# Patient Record
Sex: Male | Born: 1970 | Race: White | Hispanic: No | Marital: Married | Smoking: Former smoker
Health system: Southern US, Community
[De-identification: ages and names within clinical notes are randomized; demographics above are authoritative.]

## PROBLEM LIST (undated history)

## (undated) DIAGNOSIS — F1010 Alcohol abuse, uncomplicated: Secondary | ICD-10-CM

## (undated) DIAGNOSIS — I219 Acute myocardial infarction, unspecified: Secondary | ICD-10-CM

## (undated) DIAGNOSIS — J449 Chronic obstructive pulmonary disease, unspecified: Secondary | ICD-10-CM

## (undated) DIAGNOSIS — Z951 Presence of aortocoronary bypass graft: Secondary | ICD-10-CM

## (undated) DIAGNOSIS — M1990 Unspecified osteoarthritis, unspecified site: Secondary | ICD-10-CM

## (undated) DIAGNOSIS — I10 Essential (primary) hypertension: Secondary | ICD-10-CM

## (undated) DIAGNOSIS — D649 Anemia, unspecified: Secondary | ICD-10-CM

## (undated) DIAGNOSIS — K219 Gastro-esophageal reflux disease without esophagitis: Secondary | ICD-10-CM

## (undated) DIAGNOSIS — E785 Hyperlipidemia, unspecified: Secondary | ICD-10-CM

## (undated) DIAGNOSIS — I252 Old myocardial infarction: Secondary | ICD-10-CM

## (undated) DIAGNOSIS — F32A Depression, unspecified: Secondary | ICD-10-CM

## (undated) DIAGNOSIS — F419 Anxiety disorder, unspecified: Secondary | ICD-10-CM

## (undated) DIAGNOSIS — F329 Major depressive disorder, single episode, unspecified: Secondary | ICD-10-CM

## (undated) DIAGNOSIS — I2510 Atherosclerotic heart disease of native coronary artery without angina pectoris: Secondary | ICD-10-CM

## (undated) HISTORY — DX: Gastro-esophageal reflux disease without esophagitis: K21.9

## (undated) HISTORY — DX: Anemia, unspecified: D64.9

## (undated) HISTORY — DX: Hyperlipidemia, unspecified: E78.5

## (undated) HISTORY — DX: Alcohol abuse, uncomplicated: F10.10

## (undated) HISTORY — DX: Atherosclerotic heart disease of native coronary artery without angina pectoris: I25.10

## (undated) HISTORY — DX: Acute myocardial infarction, unspecified: I21.9

## (undated) HISTORY — DX: Unspecified osteoarthritis, unspecified site: M19.90

## (undated) HISTORY — DX: Anxiety disorder, unspecified: F41.9

## (undated) HISTORY — PX: KNEE ARTHROCENTESIS: SUR44

## (undated) HISTORY — PX: TONSILECTOMY, ADENOIDECTOMY, BILATERAL MYRINGOTOMY AND TUBES: SHX2538

## (undated) HISTORY — DX: Essential (primary) hypertension: I10

## (undated) HISTORY — PX: APPENDECTOMY: SHX54

## (undated) HISTORY — DX: Depression, unspecified: F32.A

---
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
## 2000-04-28 ENCOUNTER — Encounter: Payer: Self-pay | Admitting: Emergency Medicine

## 2000-04-28 ENCOUNTER — Emergency Department (HOSPITAL_COMMUNITY): Admission: EM | Admit: 2000-04-28 | Discharge: 2000-04-28 | Payer: Self-pay | Admitting: Emergency Medicine

## 2000-05-07 ENCOUNTER — Emergency Department (HOSPITAL_COMMUNITY): Admission: EM | Admit: 2000-05-07 | Discharge: 2000-05-07 | Payer: Self-pay | Admitting: Emergency Medicine

## 2008-05-15 DIAGNOSIS — I252 Old myocardial infarction: Secondary | ICD-10-CM

## 2008-05-15 HISTORY — DX: Old myocardial infarction: I25.2

## 2010-05-15 DIAGNOSIS — Z951 Presence of aortocoronary bypass graft: Secondary | ICD-10-CM

## 2010-05-15 HISTORY — PX: CORONARY ARTERY BYPASS GRAFT: SHX141

## 2010-05-15 HISTORY — DX: Presence of aortocoronary bypass graft: Z95.1

## 2010-10-07 DIAGNOSIS — I2510 Atherosclerotic heart disease of native coronary artery without angina pectoris: Secondary | ICD-10-CM

## 2010-10-16 DIAGNOSIS — I2510 Atherosclerotic heart disease of native coronary artery without angina pectoris: Secondary | ICD-10-CM

## 2015-01-27 DIAGNOSIS — E785 Hyperlipidemia, unspecified: Secondary | ICD-10-CM | POA: Insufficient documentation

## 2015-01-27 DIAGNOSIS — Z951 Presence of aortocoronary bypass graft: Secondary | ICD-10-CM | POA: Insufficient documentation

## 2015-01-27 DIAGNOSIS — I10 Essential (primary) hypertension: Secondary | ICD-10-CM | POA: Insufficient documentation

## 2016-06-23 DIAGNOSIS — Z720 Tobacco use: Secondary | ICD-10-CM | POA: Insufficient documentation

## 2017-09-21 DIAGNOSIS — I2510 Atherosclerotic heart disease of native coronary artery without angina pectoris: Secondary | ICD-10-CM | POA: Insufficient documentation

## 2017-09-21 DIAGNOSIS — E785 Hyperlipidemia, unspecified: Secondary | ICD-10-CM | POA: Insufficient documentation

## 2018-04-03 DIAGNOSIS — I252 Old myocardial infarction: Secondary | ICD-10-CM | POA: Insufficient documentation

## 2018-04-03 DIAGNOSIS — R0789 Other chest pain: Secondary | ICD-10-CM | POA: Insufficient documentation

## 2018-05-02 DIAGNOSIS — M5136 Other intervertebral disc degeneration, lumbar region: Secondary | ICD-10-CM | POA: Insufficient documentation

## 2018-05-02 DIAGNOSIS — M4316 Spondylolisthesis, lumbar region: Secondary | ICD-10-CM | POA: Insufficient documentation

## 2018-05-02 DIAGNOSIS — M51369 Other intervertebral disc degeneration, lumbar region without mention of lumbar back pain or lower extremity pain: Secondary | ICD-10-CM | POA: Insufficient documentation

## 2018-12-13 DIAGNOSIS — N411 Chronic prostatitis: Secondary | ICD-10-CM | POA: Insufficient documentation

## 2018-12-13 DIAGNOSIS — N138 Other obstructive and reflux uropathy: Secondary | ICD-10-CM | POA: Insufficient documentation

## 2019-02-05 ENCOUNTER — Encounter: Payer: Self-pay | Admitting: Gastroenterology

## 2019-02-11 ENCOUNTER — Other Ambulatory Visit: Payer: Self-pay

## 2019-02-11 ENCOUNTER — Ambulatory Visit (INDEPENDENT_AMBULATORY_CARE_PROVIDER_SITE_OTHER): Payer: BC Managed Care – PPO | Admitting: Gastroenterology

## 2019-02-11 ENCOUNTER — Encounter: Payer: Self-pay | Admitting: Gastroenterology

## 2019-02-11 VITALS — BP 138/86 | HR 61 | Temp 97.9°F | Resp 18 | Ht 70.0 in | Wt 194.4 lb

## 2019-02-11 DIAGNOSIS — R1011 Right upper quadrant pain: Secondary | ICD-10-CM | POA: Diagnosis not present

## 2019-02-11 DIAGNOSIS — R140 Abdominal distension (gaseous): Secondary | ICD-10-CM

## 2019-02-11 DIAGNOSIS — K219 Gastro-esophageal reflux disease without esophagitis: Secondary | ICD-10-CM

## 2019-02-11 DIAGNOSIS — Z1211 Encounter for screening for malignant neoplasm of colon: Secondary | ICD-10-CM

## 2019-02-11 DIAGNOSIS — F1010 Alcohol abuse, uncomplicated: Secondary | ICD-10-CM

## 2019-02-11 DIAGNOSIS — R1013 Epigastric pain: Secondary | ICD-10-CM

## 2019-02-11 MED ORDER — SUPREP BOWEL PREP KIT 17.5-3.13-1.6 GM/177ML PO SOLN: 1.0000 | ORAL | 0 refills | Status: DC

## 2019-02-11 NOTE — Patient Instructions (Signed)
If you are age 50 or older, your body mass index should be between 23-30. Your Body mass index is 27.89 kg/m. If this is out of the aforementioned range listed, please consider follow up with your Primary Care Provider.  If you are age 63 or younger, your body mass index should be between 19-25. Your Body mass index is 27.89 kg/m. If this is out of the aformentioned range listed, please consider follow up with your Primary Care Provider.   You have been scheduled for an endoscopy and colonoscopy. Please follow the written instructions given to you at your visit today. Please pick up your prep supplies at the pharmacy within the next 1-3 days. If you use inhalers (even only as needed), please bring them with you on the day of your procedure. Your physician has requested that you go to www.startemmi.com and enter the access code given to you at your visit today. This web site gives a general overview about your procedure. However, you should still follow specific instructions given to you by our office regarding your preparation for the procedure.  We have sent the following medications to your pharmacy for you to pick up at your convenience: Suprep  It was a pleasure to see you today!  Vito Cirigliano, D.O.

## 2019-02-11 NOTE — Progress Notes (Signed)
Chief Complaint: GERD, upper abdominal pain, bloating, EtOH use disorder  Referring Provider:     Sarajane Jews, FNP   HPI:     Stephen Walsh is a 48 y.o. male with a history of hypertension, CAD (CABG 2013), hyperlipidemia, tobacco use, EtOH abuse, GERD, referred to the [facility] for evaluation of abdominal pain, bloating, increased flatus and long standing reflux sxs.   States he has had reflux sxs for years, characterized by HB, regurgitation. No dysphagia. Treated with Nexium years ago with resolution, then changed to Prilosec 20 mg/day 3 years ago. Generally well controlled. Breakthough sxs 1-2 times/week, usually with forward flexion. No prior EGD.   Endorses RUQ pain for the last 3-4 months. Occurs intermittently, but can last through the day. Worse with EtOH. Was drinking 24 beers/day for many years. Stopped drinking 3-4 weeks ago after his PCM said to quit. No withdrawal sxs.  Pain perhaps less in intensity now, but still occurs in the RUQ and MEG.  Not associated with p.o. intake.  Did report he had LAEs in the 600's 2-3 years ago, but o/w no known hx of liver disease.  States his PCM told him that his LAE's were again elevated recently, and told him to quit drinking.  He stopped drinking 3 to 4 weeks ago, and reports he had improved LAE's on repeat labs.  None of these records are in EMR for review and he did not bring any copies today.  Otherwise, no history of ascites, jaundice, icteric sclera.  No d/c/f/c/n/v. No hematochezia, melena.   No prior colonoscopy.   No known family history of CRC, GI malignancy, liver disease, pancreatic disease, or IBD.   Past Medical History:  Diagnosis Date  . Alcohol abuse    16 oz cans-up to 18 cans per day  . Anemia   . Anxiety   . Arthritis   . Coronary artery disease   . Depression   . History of heart attack 2010  . Hx of CABG 2012  . Hypertension      No family history on file. Social History    Tobacco Use  . Smoking status: Current Every Day Smoker    Packs/day: 1.00    Types: Cigarettes    Start date: 22  . Smokeless tobacco: Former Systems developer    Types: Chew    Quit date: 1982  Substance Use Topics  . Alcohol use: Not Currently  . Drug use: Not Currently   Current Outpatient Medications  Medication Sig Dispense Refill  . aspirin 81 MG chewable tablet Chew 81 mg by mouth 2 (two) times daily.    Marland Kitchen gabapentin (NEURONTIN) 300 MG capsule Take 300 mg by mouth 3 (three) times daily.    . isosorbide dinitrate (ISORDIL) 20 MG tablet Take 20 mg by mouth daily.    . meloxicam (MOBIC) 15 MG tablet Take 15 mg by mouth daily.    . metoprolol tartrate (LOPRESSOR) 100 MG tablet Take 100 mg by mouth daily.    Marland Kitchen omeprazole (PRILOSEC) 20 MG capsule Take 20 mg by mouth daily.    . pravastatin (PRAVACHOL) 40 MG tablet Take 40 mg by mouth daily.    . tadalafil (CIALIS) 20 MG tablet Take 20 mg by mouth daily as needed for erectile dysfunction.     No current facility-administered medications for this visit.    No Known Allergies   Review of Systems: All systems reviewed  and negative except where noted in HPI.     Physical Exam:    Wt Readings from Last 3 Encounters:  02/11/19 194 lb 6.4 oz (88.2 kg)    BP 138/86 (BP Location: Left Arm, Patient Position: Sitting, Cuff Size: Normal)   Pulse 61   Temp 97.9 F (36.6 C) (Oral)   Resp 18   Ht 5\' 10"  (1.778 m)   Wt 194 lb 6.4 oz (88.2 kg)   SpO2 97%   BMI 27.89 kg/m  Constitutional:  Pleasant, in no acute distress. Psychiatric: Normal mood and affect. Behavior is normal. EENT: Pupils normal.  Conjunctivae are normal. No scleral icterus. Neck supple. No cervical LAD. Cardiovascular: Normal rate, regular rhythm. No edema Pulmonary/chest: Effort normal and breath sounds normal. No wheezing, rales or rhonchi. Abdominal: Mild TTP in RUQ and MEG without rebound or guarding. Soft, nondistended. Bowel sounds active throughout. There are  no masses palpable. No hepatomegaly. Neurological: Alert and oriented to person place and time. Skin: Skin is warm and dry. No rashes noted.   ASSESSMENT AND PLAN;   1) GERD: -Longstanding history of reflux, which is generally well controlled with omeprazole daily.  Does have breakthrough symptoms 1-2 times/week, which would suggest reflux is less well controlled than he reports. -EGD to assess for erosive esophagitis, LES laxity, hiatal hernia - Consider escalation of therapy pending EGD findings -Resume antireflux lifestyle/dietary modifications  2) MEG/RUQ pain 3) Bloating -EGD with random and directed gastric/duodenal biopsies - Assess for portal hypertension at time of EGD - Do have some degree of suspicion for RUQ pain to be related to subclinical/mild intermittent alcoholic hepatitis - May benefit from probiotic  4) Alcohol use disorder 5) Elevated liver enzymes Significant EtOH intake for many years and reported elevated liver enzymes. -Will obtain old records for review -Holding off on ordering new labs for extended serologic work-up or imaging studies pending review of recent labs -Applauded his abstinence from alcohol over the last 3 to 4 weeks and strongly encouraged him that complete avoidance of alcohol would be the strongest measure to avoid future insult to his liver  6) Colon cancer screening -Colonoscopy  The indications, risks, and benefits of EGD and colonoscopy were explained to the patient in detail. Risks include but are not limited to bleeding, perforation, adverse reaction to medications, and cardiopulmonary compromise. Sequelae include but are not limited to the possibility of surgery, hositalization, and mortality. The patient verbalized understanding and wished to proceed. All questions answered, referred to scheduler and bowel prep ordered. Further recommendations pending results of the exam.    Lavena Bullion, DO, FACG  02/11/2019, 3:26 PM   Sarajane Jews, FNP

## 2019-02-12 ENCOUNTER — Telehealth: Payer: Self-pay

## 2019-02-12 NOTE — Telephone Encounter (Signed)
BUN 11 mg/dL Creatinine 0.96 mg/dL

## 2019-02-14 ENCOUNTER — Telehealth: Payer: Self-pay | Admitting: Gastroenterology

## 2019-02-14 NOTE — Telephone Encounter (Addendum)
Received recent note dated 01/27/2019 and labs from [facility].  Lab collection from 01/27/2019 notable for the following: -Normal CBC with WBC 6.3, H/H 16.7/47.4, PLT 174 - Normal CMP with AST/ALT 26/31, ALP 72, T bili 0.7, creatinine 0.96  Labs from 11/29/2018 notable for the following: -AST/ALT 44/77, normal T bili, ALP, creatinine, albumin -Normal CBC  Labs from 09/30/2018: -AST/ALT 31/44, CMP otherwise normal  Labs from 04/02/2018: -AST/ALT 23/21, and all CMP otherwise normal  -Downtrending AST/ALT since cessation of alcohol.  Labs otherwise without serologic evidence of impaired hepatic synthetic function -Plan to proceed with EGD for upper GI symptoms as previously discussed, along with colonoscopy for routine CRC screening - No further labs needed at this time.  Repeat labs in 6 months (liver function panel) -Continue complete abstinence of alcohol

## 2019-03-06 DIAGNOSIS — Z01818 Encounter for other preprocedural examination: Secondary | ICD-10-CM | POA: Diagnosis not present

## 2019-03-07 ENCOUNTER — Encounter: Payer: Self-pay | Admitting: Gastroenterology

## 2019-03-12 ENCOUNTER — Encounter: Payer: BC Managed Care – PPO | Admitting: Gastroenterology

## 2019-03-17 DIAGNOSIS — Z01810 Encounter for preprocedural cardiovascular examination: Secondary | ICD-10-CM | POA: Insufficient documentation

## 2019-03-19 ENCOUNTER — Ambulatory Visit (AMBULATORY_SURGERY_CENTER): Payer: BC Managed Care – PPO | Admitting: Gastroenterology

## 2019-03-19 ENCOUNTER — Telehealth: Payer: Self-pay

## 2019-03-19 ENCOUNTER — Other Ambulatory Visit: Payer: Self-pay

## 2019-03-19 ENCOUNTER — Encounter: Payer: Self-pay | Admitting: Gastroenterology

## 2019-03-19 VITALS — BP 100/60 | HR 71 | Temp 98.5°F | Resp 15 | Ht 70.0 in

## 2019-03-19 DIAGNOSIS — D124 Benign neoplasm of descending colon: Secondary | ICD-10-CM | POA: Diagnosis not present

## 2019-03-19 DIAGNOSIS — K2970 Gastritis, unspecified, without bleeding: Secondary | ICD-10-CM | POA: Diagnosis not present

## 2019-03-19 DIAGNOSIS — K449 Diaphragmatic hernia without obstruction or gangrene: Secondary | ICD-10-CM | POA: Diagnosis not present

## 2019-03-19 DIAGNOSIS — K3189 Other diseases of stomach and duodenum: Secondary | ICD-10-CM | POA: Diagnosis not present

## 2019-03-19 DIAGNOSIS — K219 Gastro-esophageal reflux disease without esophagitis: Secondary | ICD-10-CM

## 2019-03-19 DIAGNOSIS — Z1211 Encounter for screening for malignant neoplasm of colon: Secondary | ICD-10-CM | POA: Diagnosis not present

## 2019-03-19 DIAGNOSIS — K2270 Barrett's esophagus without dysplasia: Secondary | ICD-10-CM

## 2019-03-19 DIAGNOSIS — D125 Benign neoplasm of sigmoid colon: Secondary | ICD-10-CM | POA: Diagnosis not present

## 2019-03-19 DIAGNOSIS — K640 First degree hemorrhoids: Secondary | ICD-10-CM

## 2019-03-19 DIAGNOSIS — D120 Benign neoplasm of cecum: Secondary | ICD-10-CM | POA: Diagnosis not present

## 2019-03-19 MED ORDER — SODIUM CHLORIDE 0.9 % IV SOLN: 500.0000 mL | INTRAVENOUS | Status: DC

## 2019-03-19 MED ORDER — OMEPRAZOLE 20 MG PO CPDR: 20.0000 mg | DELAYED_RELEASE_CAPSULE | Freq: Two times a day (BID) | ORAL | 0 refills | Status: DC

## 2019-03-19 NOTE — Progress Notes (Signed)
Pt tolerated well. Teeth unchanged. VSS. Pt arousible ands to recovery.

## 2019-03-19 NOTE — Op Note (Signed)
[place] Patient Name: Stephen Walsh Procedure Date: 03/19/2019 10:01 AM MRN: [id number] Endoscopist: Gerrit Heck , MD Age: 48 Referring MD:  Date of Birth: 03/08/1971 Gender: Male Account #: [id number] Procedure:                Colonoscopy Indications:              Screening for colorectal malignant neoplasm, This                            is the patient's first colonoscopy Medicines:                Monitored Anesthesia Care Procedure:                Pre-Anesthesia Assessment:                           - Prior to the procedure, a History and Physical                            was performed, and patient medications and                            allergies were reviewed. The patient's tolerance of                            previous anesthesia was also reviewed. The risks                            and benefits of the procedure and the sedation                            options and risks were discussed with the patient.                            All questions were answered, and informed consent                            was obtained. Prior Anticoagulants: The patient has                            taken no previous anticoagulant or antiplatelet                            agents. ASA Grade Assessment: III - A patient with                            severe systemic disease. After reviewing the risks                            and benefits, the patient was deemed in                            satisfactory condition to undergo the procedure.  After obtaining informed consent, the colonoscope                            was passed under direct vision. Throughout the                            procedure, the patient's blood pressure, pulse, and                            oxygen saturations were monitored continuously. The                            Colonoscope was introduced through the anus and                            advanced to the the  cecum, identified by                            appendiceal orifice and ileocecal valve. The                            colonoscopy was performed without difficulty. The                            patient tolerated the procedure well. The quality                            of the bowel preparation was adequate. The                            ileocecal valve, appendiceal orifice, and rectum                            were photographed. Scope In: 10:26:09 AM Scope Out: 11:09:17 AM Scope Withdrawal Time: 0 hours 40 minutes 8 seconds  Total Procedure Duration: 0 hours 43 minutes 8 seconds  Findings:                 The perianal and digital rectal examinations were                            normal.                           A 8 mm polyp was found in the cecum. The polyp was                            sessile. The polyp was removed with a cold snare.                            Resection and retrieval were complete. Estimated                            blood loss was minimal.  A 15 mm polyp was found in the descending colon.                            The polyp was flat. The polyp was removed with a                            saline injection-lift technique using a cold snare.                            Resection and retrieval were complete. There was a                            single site of mucosal oozing that was successfully                            treated with one hemostatic clip (MR conditional).                            There was no bleeding at the end of the procedure.                            Estimated blood loss was minimal.                           Four sessile polyps were found in the sigmoid                            colon. The polyps were 4 to 8 mm in size. These                            polyps were removed with a cold snare. Resection                            and retrieval were complete. Estimated blood loss                            was  minimal.                           Non-bleeding internal hemorrhoids were found during                            retroflexion. The hemorrhoids were small. Complications:            No immediate complications. Estimated Blood Loss:     Estimated blood loss was minimal. Impression:               - One 8 mm polyp in the cecum, removed with a cold                            snare. Resected and retrieved.                           -  One 15 mm polyp in the descending colon, removed                            using injection-lift and a cold snare. Resected and                            retrieved. Clip (MR conditional) was placed.                           - Four 4 to 8 mm polyps in the sigmoid colon,                            removed with a cold snare. Resected and retrieved.                           - Non-bleeding internal hemorrhoids. Recommendation:           - Patient has a contact number available for                            emergencies. The signs and symptoms of potential                            delayed complications were discussed with the                            patient. Return to normal activities tomorrow.                            Written discharge instructions were provided to the                            patient.                           - Resume previous diet.                           - Continue present medications.                           - Await pathology results.                           - Repeat colonoscopy in 1 year for surveillance.                           - Return to GI clinic at appointment to be                            scheduled. Gerrit Heck, MD 03/19/2019 11:21:04 AM

## 2019-03-19 NOTE — Progress Notes (Signed)
Pt took a while to wake after procedure, confused and pulling off gown, etc, pt wakes after about 20 minutes and is lucid and alert.  Pt instructed as per usual re discharge instructions, reviewed with wife on the phone and let her know to call office if any questions.

## 2019-03-19 NOTE — Patient Instructions (Signed)
Handouts given for gastritis, Hiatal Hernia, GERD, polyps and hemorrhoids.  Clip card give for clip that is in place, you may pass it in your stool over the next couple weeks, there is nothing you need to follow up on with this, carry card in your wallet.  prilosec 20 mg is changed to TWICE daily for 6 weeks (you were taking it once daily) please pick up new Rx.  GI clinic will call to schedule a follow up appointment.  YOU HAD AN ENDOSCOPIC PROCEDURE TODAY AT [facility]:   Refer to the procedure report that was given to you for any specific questions about what was found during the examination.  If the procedure report does not answer your questions, please call your gastroenterologist to clarify.  If you requested that your care partner not be given the details of your procedure findings, then the procedure report has been included in a sealed envelope for you to review at your convenience later.  YOU SHOULD EXPECT: Some feelings of bloating in the abdomen. Passage of more gas than usual.  Walking can help get rid of the air that was put into your GI tract during the procedure and reduce the bloating. If you had a lower endoscopy (such as a colonoscopy or flexible sigmoidoscopy) you may notice spotting of blood in your stool or on the toilet paper. If you underwent a bowel prep for your procedure, you may not have a normal bowel movement for a few days.  Please Note:  You might notice some irritation and congestion in your nose or some drainage.  This is from the oxygen used during your procedure.  There is no need for concern and it should clear up in a day or so.  SYMPTOMS TO REPORT IMMEDIATELY:   Following lower endoscopy (colonoscopy or flexible sigmoidoscopy):  Excessive amounts of blood in the stool  Significant tenderness or worsening of abdominal pains  Swelling of the abdomen that is new, acute  Fever of 100F or higher   Following upper endoscopy (EGD)  Vomiting  of blood or coffee ground material  New chest pain or pain under the shoulder blades  Painful or persistently difficult swallowing  New shortness of breath  Fever of 100F or higher  Black, tarry-looking stools  For urgent or emergent issues, a gastroenterologist can be reached at any hour by calling [phone].   DIET:  We do recommend a small meal at first, but then you may proceed to your regular diet.  Drink plenty of fluids but you should avoid alcoholic beverages for 24 hours.  ACTIVITY:  You should plan to take it easy for the rest of today and you should NOT DRIVE or use heavy machinery until tomorrow (because of the sedation medicines used during the test).    FOLLOW UP: Our staff will call the number listed on your records 48-72 hours following your procedure to check on you and address any questions or concerns that you may have regarding the information given to you following your procedure. If we do not reach you, we will leave a message.  We will attempt to reach you two times.  During this call, we will ask if you have developed any symptoms of COVID 19. If you develop any symptoms (ie: fever, flu-like symptoms, shortness of breath, cough etc.) before then, please call [phone].  If you test positive for Covid 19 in the 2 weeks post procedure, please call and report this information to [place].  If any biopsies were taken you will be contacted by phone or by letter within the next 1-3 weeks.  Please call us at [phone] if you have not heard about the biopsies in 3 weeks.    SIGNATURES/CONFIDENTIALITY: You and/or your care partner have signed paperwork which will be entered into your electronic medical record.  These signatures attest to the fact that that the information above on your After Visit Summary has been reviewed and is understood.  Full responsibility of the confidentiality of this discharge information lies with you and/or your care-partner.

## 2019-03-19 NOTE — Telephone Encounter (Signed)
Approval for Omeprazole taking 2 times daily Case number [id number]

## 2019-03-19 NOTE — Op Note (Signed)
[facility] [place] Patient Name: Stephen Walsh Procedure Date: 03/19/2019 10:02 AM MRN: [id number] Endoscopist: Gerrit Heck , MD Age: 48 Referring MD:  Date of Birth: Apr 02, 1971 Gender: Male Account #: [id number] Procedure:                Upper GI endoscopy Indications:              Epigastric abdominal pain, Abdominal pain in the                            right upper quadrant, Suspected esophageal reflux Medicines:                Monitored Anesthesia Care Procedure:                Pre-Anesthesia Assessment:                           - Prior to the procedure, a History and Physical                            was performed, and patient medications and                            allergies were reviewed. The patient's tolerance of                            previous anesthesia was also reviewed. The risks                            and benefits of the procedure and the sedation                            options and risks were discussed with the patient.                            All questions were answered, and informed consent                            was obtained. Prior Anticoagulants: The patient has                            taken no previous anticoagulant or antiplatelet                            agents. ASA Grade Assessment: III - A patient with                            severe systemic disease. After reviewing the risks                            and benefits, the patient was deemed in                            satisfactory condition to undergo the procedure.  After obtaining informed consent, the endoscope was                            passed under direct vision. Throughout the                            procedure, the patient's blood pressure, pulse, and                            oxygen saturations were monitored continuously. The                            Endoscope was introduced through the mouth, and   advanced to the second part of duodenum. The upper                            GI endoscopy was accomplished without difficulty.                            The patient tolerated the procedure well. Scope In: Scope Out: Findings:                 Two tongues of salmon-colored mucosa were present                            from 39 to 40 cm. No other visible abnormalities                            were present. Biopsies were taken with a cold                            forceps for histology. Estimated blood loss was                            minimal.                           Esophagogastric landmarks were identified: the                            gastroesophageal junction was found at 40 cm and                            the site of hiatal narrowing was found at 43 cm                            from the incisors.                           A 3 cm hiatal hernia was present.                           Scattered mild inflammation characterized by  erythema was found in the gastric body and in the                            gastric antrum. Biopsies were taken with a cold                            forceps for Helicobacter pylori testing. Estimated                            blood loss was minimal.                           The gastric fundus was normal.                           Localized mildly erythematous mucosa without active                            bleeding and with no stigmata of bleeding was found                            in the duodenal bulb. Biopsies were taken with a                            cold forceps for histology. Estimated blood loss                            was minimal.                           The second portion of the duodenum was normal. Complications:            No immediate complications. Estimated Blood Loss:     Estimated blood loss was minimal. Impression:               - Salmon-colored mucosa suspicious for                             short-segment Barrett's esophagus. Biopsied.                           - Esophagogastric landmarks identified.                           - 3 cm hiatal hernia.                           - Gastritis. Biopsied.                           - Normal gastric fundus.                           - Erythematous duodenopathy. Biopsied.                           - Normal second portion  of the duodenum. Recommendation:           - Patient has a contact number available for                            emergencies. The signs and symptoms of potential                            delayed complications were discussed with the                            patient. Return to normal activities tomorrow.                            Written discharge instructions were provided to the                            patient.                           - Resume previous diet.                           - Continue present medications.                           - Await pathology results.                           - Use Prilosec (omeprazole) 20 mg PO BID for 6                            weeks. Gerrit Heck, MD 03/19/2019 11:27:58 AM

## 2019-03-19 NOTE — Progress Notes (Signed)
Called to room to assist during endoscopic procedure.  Patient ID and intended procedure confirmed with present staff. Received instructions for my participation in the procedure from the performing physician.  

## 2019-03-21 ENCOUNTER — Telehealth: Payer: Self-pay | Admitting: *Deleted

## 2019-03-21 NOTE — Telephone Encounter (Signed)
  Follow up Call-  Call back number 03/19/2019  Post procedure Call Back phone  # [phone] talk to wife  Permission to leave phone message Yes  Some recent data might be hidden  1.  Have you developed a fever since your procedure? no  2.   Have you had an respiratory symptoms (SOB or cough) since your procedure? no  3.   Have you tested positive for COVID 19 since your procedure no  4.   Have you had any family members/close contacts diagnosed with the COVID 19 since your procedure?  no   If yes to any of these questions please route to Joylene John, RN and Alphonsa Gin, Therapist, sports.   Patient questions:  Do you have a fever, pain , or abdominal swelling? No. Pain Score  0 *  Have you tolerated food without any problems? Yes.    Have you been able to return to your normal activities? Yes.    Do you have any questions about your discharge instructions: Diet   No. Medications  No. Follow up visit  No.  Do you have questions or concerns about your Care? No.  Actions: * If pain score is 4 or above: No action needed, pain <4.

## 2019-04-07 HISTORY — PX: BACK SURGERY: SHX140

## 2019-04-21 ENCOUNTER — Other Ambulatory Visit: Payer: Self-pay

## 2019-04-21 ENCOUNTER — Ambulatory Visit (INDEPENDENT_AMBULATORY_CARE_PROVIDER_SITE_OTHER): Payer: BC Managed Care – PPO | Admitting: Gastroenterology

## 2019-04-21 ENCOUNTER — Encounter: Payer: Self-pay | Admitting: Gastroenterology

## 2019-04-21 VITALS — BP 124/76 | HR 71 | Temp 97.7°F | Ht 71.0 in | Wt 207.5 lb

## 2019-04-21 DIAGNOSIS — K219 Gastro-esophageal reflux disease without esophagitis: Secondary | ICD-10-CM

## 2019-04-21 DIAGNOSIS — K5903 Drug induced constipation: Secondary | ICD-10-CM

## 2019-04-21 DIAGNOSIS — Z1159 Encounter for screening for other viral diseases: Secondary | ICD-10-CM | POA: Diagnosis not present

## 2019-04-21 DIAGNOSIS — K449 Diaphragmatic hernia without obstruction or gangrene: Secondary | ICD-10-CM

## 2019-04-21 DIAGNOSIS — F17200 Nicotine dependence, unspecified, uncomplicated: Secondary | ICD-10-CM

## 2019-04-21 DIAGNOSIS — K22710 Barrett's esophagus with low grade dysplasia: Secondary | ICD-10-CM | POA: Diagnosis not present

## 2019-04-21 DIAGNOSIS — F1021 Alcohol dependence, in remission: Secondary | ICD-10-CM

## 2019-04-21 DIAGNOSIS — Z86010 Personal history of colonic polyps: Secondary | ICD-10-CM

## 2019-04-21 NOTE — Patient Instructions (Signed)
You have been scheduled for an endoscopy. Please follow written instructions given to you at your visit today. If you use inhalers (even only as needed), please bring them with you on the day of your procedure. Your physician has requested that you go to www.startemmi.com and enter the access code given to you at your visit today. This web site gives a general overview about your procedure. However, you should still follow specific instructions given to you by our office regarding your preparation for the procedure.  It was a pleasure to see you today!  Vito Cirigliano, D.O.  

## 2019-04-21 NOTE — Progress Notes (Signed)
P  Chief Complaint:    Constipation, GERD with Barrett's  GI History: 48 y.o. male with a history of hypertension, CAD (CABG 2013), hyperlipidemia, tobacco use, EtOH abuse, who follows in the GI clinic for the following issues:  1) GERD with Barrett's Esophagus: Longstanding history of reflux,characterized by HB, regurgitation. No dysphagia. Treated with Nexium years ago with resolution, then changed to Prilosec 20 mg/day 3 years ago. Generally well controlled. Breakthough sxs 1-2 times/week, usually with forward flexion.  Index EGD in 03/2019 with short segment Barrett's Esophagus with low-grade dysplasia approaching high-grade.  Started on high-dose PPI therapy, with plan for short interval repeat EGD.  2) RUQ pain: Symptoms occurred intermittently, worse with EtOH intake.  Was drinking 24 beers/day for many years, but stopped drinking in 12/2018 after his PCM told him to quit.  Symptoms have since resolved.  EGD  3) Elevated liver enzymes: 4) History of EtOH use disorder: Long history of heavy EtOH intake, but quit drinking entirely in 12/2018.  Reported history of AST/ALT in the 600s approximately 2017 per patient.  Normal LAE's in 01/2019 after quitting EtOH.  PLT 174, normal CBC.  EGD negative for varices or PHG.  Endoscopic history: -EGD (03/2019, Dr. Bryan Lemma): 2 tongues of Barrett's Esophagus, each 1 cm (biopsy: Low-grade dysplasia approaching high-grade dysplasia), 3 cm HH, mild gastritis/duodenitis -Colonoscopy (03/2019, Dr. Bryan Lemma): 6 tubular adenomas, largest 15 mm resected with saline inject-left and snare.  Internal hemorrhoids.  Repeat in 3 years  HPI:    Patient is a 48 y.o. male presenting to the Gastroenterology Clinic for follow-up. Initally seen by me in 01/2019 for GERD, upper abdominal pain, bloating, as above.  Abdominal pain and bloating have since resolved.  Reflux generally well controlled since starting on high-dose PPI therapy.  Rare use of Tums for  breakthrough.  EGD with short segment BE with low grade dysplasia approaching high grade, with plan to repeat in 2 months wth repeat bxs. Started on PPI BID with good clinical improvement.    Colonoscopy completed for routine, average risk CRC screening and n/f 6 TAs, largest 15 mm in size, resected with saline inject/lift and snare, with plan to repeat in 3 years.   L-spine surgery last week. Now with constipation due to pain meds post op. Tried Mag Citrate (1/2 bottle) without improvement. Has been taking stools softeners with improvement.   Has since quit smoking a month ago, with Wellbutrin for cessation, which he has since stopped.  Still no EtOH since 12/2018.     Review of systems:     No chest pain, no SOB, no fevers, no urinary sx   Past Medical History:  Diagnosis Date  . Alcohol abuse    16 oz cans-up to 18 cans per day  . Anemia   . Anxiety   . Arthritis   . Coronary artery disease   . Depression   . GERD (gastroesophageal reflux disease)   . History of heart attack 2010  . Hx of CABG 2012  . Hyperlipidemia   . Hypertension   . Myocardial infarction [facility])     Patient's surgical history, family medical history, social history, medications and allergies were all reviewed in Epic    Current Outpatient Medications  Medication Sig Dispense Refill  . aspirin 81 MG chewable tablet Chew 81 mg by mouth 2 (two) times daily.    Marland Kitchen gabapentin (NEURONTIN) 300 MG capsule Take 300 mg by mouth 3 (three) times daily.    . isosorbide  dinitrate (ISORDIL) 20 MG tablet Take 20 mg by mouth daily.    . meloxicam (MOBIC) 15 MG tablet Take 15 mg by mouth daily.    . metoprolol tartrate (LOPRESSOR) 100 MG tablet Take 100 mg by mouth daily.    Marland Kitchen omeprazole (PRILOSEC) 20 MG capsule Take 1 capsule (20 mg total) by mouth 2 (two) times daily before a meal. 90 capsule 0  . oxyCODONE (OXY IR/ROXICODONE) 5 MG immediate release tablet Take 5 mg by mouth 4 (four) times daily as needed.    .  pravastatin (PRAVACHOL) 40 MG tablet Take 40 mg by mouth daily.    . tadalafil (CIALIS) 20 MG tablet Take 20 mg by mouth daily as needed for erectile dysfunction.     No current facility-administered medications for this visit.     Physical Exam:     BP 124/76   Pulse 71   Temp 97.7 F (36.5 C)   Ht 5\' 11"  (1.803 m)   Wt 207 lb 8 oz (94.1 kg)   BMI 28.94 kg/m   GENERAL:  Pleasant male in NAD PSYCH: : Cooperative, normal affect EENT:  conjunctiva pink, mucous membranes moist, neck supple without masses CARDIAC:  RRR, no murmur heard, no peripheral edema PULM: Normal respiratory effort, lungs CTA bilaterally, no wheezing ABDOMEN:  Nondistended, soft, nontender. No obvious masses, no hepatomegaly,  normal bowel sounds SKIN:  turgor, no lesions seen Musculoskeletal:  Normal muscle tone, normal strength NEURO: Alert and oriented x 3, no focal neurologic deficits   IMPRESSION and PLAN:    1) GERD 2) Barrett's esophagus with low-grade dysplasia and possible high-grade dysplasia 3) Hiatal hernia -Resume high-dose acid suppression therapy -Repeat short interval EGD with repeat biopsies to differentiate presence of high-grade dysplasia or even possibly small focus of EAC -Discussed treatment strategies of Barrett's Esophagus at length today, to include ablation.  Will plan to discuss again in detail following repeat endoscopy/repeat biopsies  4) Tubular adenomas: -Repeat colonoscopy in 3 years for ongoing surveillance  5) Constipation: Temporally related to pain medications following recent back surgery. -Resume OTC stool softeners.  Can add MiraLAX as needed -Resume adequate hydration -Wean from pain medications when possible and per spine surgeon  6) History of EtOH use disorder 7) History of tobacco use -Has since quit smoking, and no EtOH since 12/2018.  Congratulated him on both of these very important successes, and encouraged him for ongoing abstinence of each  8) RUQ  pain-resolved     The indications, risks, and benefits of EGD were explained to the patient in detail. Risks include but are not limited to bleeding, perforation, adverse reaction to medications, and cardiopulmonary compromise. Sequelae include but are not limited to the possibility of surgery, hositalization, and mortality. The patient verbalized understanding and wished to proceed. All questions answered, referred to scheduler. Further recommendations pending results of the exam.       Lavena Bullion ,DO, FACG 04/21/2019, 2:21 PM

## 2019-05-02 ENCOUNTER — Encounter: Payer: Self-pay | Admitting: Gastroenterology

## 2019-05-12 ENCOUNTER — Ambulatory Visit (INDEPENDENT_AMBULATORY_CARE_PROVIDER_SITE_OTHER): Payer: BC Managed Care – PPO

## 2019-05-12 DIAGNOSIS — Z1159 Encounter for screening for other viral diseases: Secondary | ICD-10-CM

## 2019-05-13 ENCOUNTER — Other Ambulatory Visit: Payer: Self-pay | Admitting: Gastroenterology

## 2019-05-14 LAB — SARS CORONAVIRUS 2 (TAT 6-24 HRS): SARS Coronavirus 2: NEGATIVE

## 2019-05-15 ENCOUNTER — Other Ambulatory Visit: Payer: Self-pay

## 2019-05-15 ENCOUNTER — Ambulatory Visit (AMBULATORY_SURGERY_CENTER): Payer: BC Managed Care – PPO | Admitting: Gastroenterology

## 2019-05-15 ENCOUNTER — Encounter: Payer: Self-pay | Admitting: Gastroenterology

## 2019-05-15 VITALS — BP 104/59 | HR 73 | Temp 98.3°F | Resp 13 | Ht 71.0 in | Wt 207.0 lb

## 2019-05-15 DIAGNOSIS — K2100 Gastro-esophageal reflux disease with esophagitis, without bleeding: Secondary | ICD-10-CM | POA: Diagnosis not present

## 2019-05-15 DIAGNOSIS — K2970 Gastritis, unspecified, without bleeding: Secondary | ICD-10-CM | POA: Diagnosis not present

## 2019-05-15 DIAGNOSIS — K449 Diaphragmatic hernia without obstruction or gangrene: Secondary | ICD-10-CM

## 2019-05-15 DIAGNOSIS — K2270 Barrett's esophagus without dysplasia: Secondary | ICD-10-CM | POA: Diagnosis not present

## 2019-05-15 DIAGNOSIS — K22710 Barrett's esophagus with low grade dysplasia: Secondary | ICD-10-CM

## 2019-05-15 MED ORDER — OMEPRAZOLE 20 MG PO CPDR: 20.0000 mg | DELAYED_RELEASE_CAPSULE | Freq: Every day | ORAL | 5 refills | Status: DC

## 2019-05-15 MED ORDER — SODIUM CHLORIDE 0.9 % IV SOLN: 500.0000 mL | Freq: Once | INTRAVENOUS | Status: DC

## 2019-05-15 NOTE — Op Note (Addendum)
[facility] Patient Name: Stephen Walsh Procedure Date: 05/15/2019 8:28 AM MRN: [id number] Endoscopist: Gerrit Heck , MD Age: 48 Referring MD:  Date of Birth: 1970-12-12 Gender: Male Account #: [id number] Procedure:                Upper GI endoscopy Indications:              Esophageal reflux, Follow-up of Barrett's esophagus                           48 yo male with recent EGD in 03/2019 with 1 cm                            segment of Barrett's Esophagus with biopsies                            demonstrating "low grade dysplasia, focally                            approching high grade dysplasia". He was started on                            omeprazole 20 mg BID with clinical improvement in                            reflux, and presents today for short interval                            repeat EGD with repeat biopsies. Medicines:                Monitored Anesthesia Care Procedure:                Pre-Anesthesia Assessment:                           - Prior to the procedure, a History and Physical                            was performed, and patient medications and                            allergies were reviewed. The patient's tolerance of                            previous anesthesia was also reviewed. The risks                            and benefits of the procedure and the sedation                            options and risks were discussed with the patient.                            All questions were answered, and informed consent  was obtained. Prior Anticoagulants: The patient has                            taken no previous anticoagulant or antiplatelet                            agents. ASA Grade Assessment: II - A patient with                            mild systemic disease. After reviewing the risks                            and benefits, the patient was deemed in                            satisfactory condition to undergo  the procedure.                           After obtaining informed consent, the endoscope was                            passed under direct vision. Throughout the                            procedure, the patient's blood pressure, pulse, and                            oxygen saturations were monitored continuously. The                            Endoscope was introduced through the mouth, and                            advanced to the second part of duodenum. The upper                            GI endoscopy was accomplished without difficulty.                            The patient tolerated the procedure well. Scope In: Scope Out: Findings:                 The esophagus and gastroesophageal junction were                            examined with white light and narrow band imaging                            (NBI) from a forward view and retroflexed position.                            There were esophageal mucosal changes secondary to  established short-segment Barrett's disease. These                            changes involved the mucosa at the upper extent of                            the gastric folds (40 cm from the incisors)                            extending to the Z-line (39.5 cm from the                            incisors). Two tongues of salmon-colored mucosa                            were present from 39 to 40 cm. There was a single                            island of salmon colored mucosa also located at 39                            cm from the incisors. The maximum longitudinal                            extent of these esophageal mucosal changes was 1 cm                            in length. No nodules. Mucosa was extensively                            biopsied with a cold forceps for histology in a                            targeted manner and in 4 quadrants at intervals of                            0.5 cm. One specimen bottle was sent to  pathology.                            Estimated blood loss was minimal.                           A 3 cm hiatal hernia was present. Retroflexed view                            into the hernia demonstrated changes consistent                            with Barrett's Esophagus as above.                           The upper third of the esophagus and middle third  of the esophagus were normal.                           The gastric body, incisura and pylorus were normal.                           Localized minimal inflammation characterized by                            erythema was found in the gastric antrum. This was                            much improved from the prior study.                           The duodenal bulb, first portion of the duodenum                            and second portion of the duodenum were normal. Complications:            No immediate complications. Estimated Blood Loss:     Estimated blood loss was minimal. Impression:               - Esophageal mucosal changes secondary to                            established short-segment Barrett's disease.                            Biopsied.                           - 3 cm hiatal hernia.                           - Normal upper third of esophagus and middle third                            of esophagus.                           - Normal gastric body, incisura and pylorus.                           - Gastritis.                           - Normal duodenal bulb, first portion of the                            duodenum and second portion of the duodenum. Recommendation:           - Patient has a contact number available for                            emergencies. The signs and symptoms of potential  delayed complications were discussed with the                            patient. Return to normal activities tomorrow.                            Written discharge  instructions were provided to the                            patient.                           - Resume previous diet.                           - Continue present medications.                           - Await pathology results.                           - Repeat upper endoscopy for surveillance at an                            interval depending on repeat biopsies today. If                            ongoing evidence of dysplasia, will also plan to                            discuss the role and risks/benefits of ablative                            therapy during clinic follow-up.                           - Return to GI clinic at appointment to be                            scheduled.                           - Resume Prilosec (omeprazole) 20 mg PO BID. Refill                            placed today. Gerrit Heck, MD 05/15/2019 8:54:45 AM

## 2019-05-15 NOTE — Progress Notes (Signed)
PT taken to PACU. Monitors in place. VSS. Report given to RN. 

## 2019-05-15 NOTE — Patient Instructions (Signed)
Handouts given Barretts esophagus Hiatal Hernia   Continue previous diet Continue present medications Await pathology results  Continue Prilosec 20mg  PO BID      YOU HAD AN ENDOSCOPIC PROCEDURE TODAY AT [place]:   Refer to the procedure report that was given to you for any specific questions about what was found during the examination.  If the procedure report does not answer your questions, please call your gastroenterologist to clarify.  If you requested that your care partner not be given the details of your procedure findings, then the procedure report has been included in a sealed envelope for you to review at your convenience later.  YOU SHOULD EXPECT: Some feelings of bloating in the abdomen. Passage of more gas than usual.  Walking can help get rid of the air that was put into your GI tract during the procedure and reduce the bloating. If you had a lower endoscopy (such as a colonoscopy or flexible sigmoidoscopy) you may notice spotting of blood in your stool or on the toilet paper. If you underwent a bowel prep for your procedure, you may not have a normal bowel movement for a few days.  Please Note:  You might notice some irritation and congestion in your nose or some drainage.  This is from the oxygen used during your procedure.  There is no need for concern and it should clear up in a day or so.  SYMPTOMS TO REPORT IMMEDIATELY:    Following upper endoscopy (EGD)  Vomiting of blood or coffee ground material  New chest pain or pain under the shoulder blades  Painful or persistently difficult swallowing  New shortness of breath  Fever of 100F or higher  Black, tarry-looking stools  For urgent or emergent issues, a gastroenterologist can be reached at any hour by calling [phone].   DIET:  We do recommend a small meal at first, but then you may proceed to your regular diet.  Drink plenty of fluids but you should avoid alcoholic beverages for 24  hours.  ACTIVITY:  You should plan to take it easy for the rest of today and you should NOT DRIVE or use heavy machinery until tomorrow (because of the sedation medicines used during the test).    FOLLOW UP: Our staff will call the number listed on your records 48-72 hours following your procedure to check on you and address any questions or concerns that you may have regarding the information given to you following your procedure. If we do not reach you, we will leave a message.  We will attempt to reach you two times.  During this call, we will ask if you have developed any symptoms of COVID 19. If you develop any symptoms (ie: fever, flu-like symptoms, shortness of breath, cough etc.) before then, please call [phone].  If you test positive for Covid 19 in the 2 weeks post procedure, please call and report this information to [place].    If any biopsies were taken you will be contacted by phone or by letter within the next 1-3 weeks.  Please call us at [phone] if you have not heard about the biopsies in 3 weeks.    SIGNATURES/CONFIDENTIALITY: You and/or your care partner have signed paperwork which will be entered into your electronic medical record.  These signatures attest to the fact that that the information above on your After Visit Summary has been reviewed and is understood.  Full responsibility of the confidentiality of this discharge information  lies with you and/or your care-partner.

## 2019-05-15 NOTE — Progress Notes (Signed)
Called to room to assist during endoscopic procedure.  Patient ID and intended procedure confirmed with present staff. Received instructions for my participation in the procedure from the performing physician.  

## 2019-05-15 NOTE — Progress Notes (Signed)
Temp by YF, Vitals by CW

## 2019-05-19 ENCOUNTER — Telehealth: Payer: Self-pay

## 2019-05-19 NOTE — Telephone Encounter (Signed)
Patient returned call to the office-patient has been scheduled (by CMA) on 06/17/2019 at 1:20 pm with Dr. Bryan Lemma;

## 2019-05-19 NOTE — Telephone Encounter (Signed)
Left message for patient to call back to the office -needs ROV: 4 wk f/u post EGD-eso reflux/f/u of Barrett's eso/Cirigliano/

## 2019-05-20 ENCOUNTER — Telehealth: Payer: Self-pay | Admitting: *Deleted

## 2019-05-20 ENCOUNTER — Telehealth: Payer: Self-pay

## 2019-05-20 NOTE — Telephone Encounter (Signed)
Called [phone] and left a message we tried to reach pt for a follow up call. maw

## 2019-05-20 NOTE — Telephone Encounter (Signed)
  Follow up Call-  Call back number 05/15/2019 03/19/2019  Post procedure Call Back phone  # [phone] talk to wife  Permission to leave phone message Yes Yes  Some recent data might be hidden     Patient questions:  Do you have a fever, pain , or abdominal swelling? No. Pain Score  0 *  Have you tolerated food without any problems? Yes.    Have you been able to return to your normal activities? Yes.    Do you have any questions about your discharge instructions: Diet   No. Medications  No. Follow up visit  No.  Do you have questions or concerns about your Care? No.  Actions: * If pain score is 4 or above: No action needed, pain <4.  1. Have you developed a fever since your procedure? no  2.   Have you had an respiratory symptoms (SOB or cough) since your procedure? no  3.   Have you tested positive for COVID 19 since your procedure no  4.   Have you had any family members/close contacts diagnosed with the COVID 19 since your procedure?  no   If yes to any of these questions please route to Joylene John, RN and Alphonsa Gin, Therapist, sports.

## 2019-06-05 ENCOUNTER — Encounter: Payer: Self-pay | Admitting: Gastroenterology

## 2019-06-17 ENCOUNTER — Ambulatory Visit: Payer: BC Managed Care – PPO | Admitting: Gastroenterology

## 2019-07-14 ENCOUNTER — Telehealth: Payer: Self-pay | Admitting: Family Medicine

## 2019-07-14 ENCOUNTER — Telehealth: Payer: Self-pay | Admitting: Gastroenterology

## 2019-07-14 NOTE — Telephone Encounter (Signed)
Pt needs rf for omeprazole sent to [facility]. Pt states that does was increased to 1 BID so pharmacy needs prescription with new dose.

## 2019-07-15 ENCOUNTER — Other Ambulatory Visit: Payer: Self-pay | Admitting: Gastroenterology

## 2019-07-15 MED ORDER — OMEPRAZOLE 20 MG PO CPDR: 20.0000 mg | DELAYED_RELEASE_CAPSULE | Freq: Two times a day (BID) | ORAL | 3 refills | Status: DC

## 2019-07-15 NOTE — Telephone Encounter (Signed)
Sent Omeprazole to pharmacy

## 2020-05-27 ENCOUNTER — Encounter: Payer: Self-pay | Admitting: Gastroenterology

## 2021-05-05 ENCOUNTER — Ambulatory Visit: Payer: Self-pay | Admitting: Urology

## 2021-10-12 ENCOUNTER — Other Ambulatory Visit: Payer: Self-pay | Admitting: Family Medicine

## 2021-10-12 DIAGNOSIS — R918 Other nonspecific abnormal finding of lung field: Secondary | ICD-10-CM

## 2021-10-12 DIAGNOSIS — R0602 Shortness of breath: Secondary | ICD-10-CM

## 2021-10-14 ENCOUNTER — Ambulatory Visit (INDEPENDENT_AMBULATORY_CARE_PROVIDER_SITE_OTHER): Payer: 59

## 2021-10-14 DIAGNOSIS — R0602 Shortness of breath: Secondary | ICD-10-CM | POA: Diagnosis not present

## 2021-10-14 DIAGNOSIS — R634 Abnormal weight loss: Secondary | ICD-10-CM | POA: Diagnosis not present

## 2021-10-14 DIAGNOSIS — R918 Other nonspecific abnormal finding of lung field: Secondary | ICD-10-CM

## 2021-10-14 LAB — I-STAT CREATININE (MANUAL ENTRY): Creatinine, Ser: 0.8 (ref 0.50–1.10)

## 2021-10-14 MED ORDER — IOHEXOL 300 MG/ML  SOLN
75.0000 mL | Freq: Once | INTRAMUSCULAR | Status: AC | PRN
  Administered 2021-10-14: 75 mL via INTRAVENOUS

## 2021-11-30 ENCOUNTER — Ambulatory Visit (INDEPENDENT_AMBULATORY_CARE_PROVIDER_SITE_OTHER): Payer: 59 | Admitting: Podiatrist

## 2021-11-30 ENCOUNTER — Encounter: Payer: Self-pay | Admitting: Podiatrist

## 2021-11-30 DIAGNOSIS — L600 Ingrowing nail: Secondary | ICD-10-CM

## 2021-11-30 NOTE — Progress Notes (Signed)
Chief Complaint  Patient presents with   Ingrown Toenail    PT CAME IN FOR A INGROWN ON THE LEFT HALLUX MEDIAL BORDER     HPI: Patient is 51 y.o. male who presents today for ingrown toenail left great toenail , medial border.  The nail has been bothering him for 5 years and he has tried cutting it out himself.  He relates the nail is sore in shoes.  He also states when he cuts the nail out it is comfortable for a while and then grows back into the skin.  He would like the nail fixed if possible.   There are no problems to display for this patient.   Current Outpatient Medications on File Prior to Visit  Medication Sig Dispense Refill   aspirin 81 MG chewable tablet Chew 81 mg by mouth 2 (two) times daily.     gabapentin (NEURONTIN) 300 MG capsule Take 300 mg by mouth 3 (three) times daily.     isosorbide dinitrate (ISORDIL) 20 MG tablet Take 20 mg by mouth daily.     meloxicam (MOBIC) 15 MG tablet Take 15 mg by mouth daily.     metoprolol tartrate (LOPRESSOR) 100 MG tablet Take 100 mg by mouth daily.     omeprazole (PRILOSEC) 20 MG capsule Take 1 capsule (20 mg total) by mouth daily. 90 capsule 5   omeprazole (PRILOSEC) 20 MG capsule Take 1 capsule (20 mg total) by mouth 2 (two) times daily before a meal. 90 capsule 3   oxyCODONE (OXY IR/ROXICODONE) 5 MG immediate release tablet Take 5 mg by mouth 4 (four) times daily as needed.     pravastatin (PRAVACHOL) 40 MG tablet Take 40 mg by mouth daily.     tadalafil (CIALIS) 20 MG tablet Take 20 mg by mouth daily as needed for erectile dysfunction.     No current facility-administered medications on file prior to visit.    No Known Allergies  Review of Systems No fevers, chills, nausea, muscle aches, no difficulty breathing, no calf pain, no chest pain or shortness of breath.   Physical Exam  GENERAL APPEARANCE: Alert, conversant. Appropriately groomed. No acute distress.   VASCULAR: Pedal pulses palpable 2/4 DP and 2/4 PT Left   Capillary refill time is immediate to all digits,  Proximal to distal cooling it warm to warm.  Digital perfusion adequate.   NEUROLOGIC: sensation is intact to 5.07 monofilament at 3/5 sites left.  Light touch is intact vibratory sensation intact left.   MUSCULOSKELETAL: acceptable muscle strength, tone and stability left. Pes planus foot type is noted left foot.   DERMATOLOGIC: skin is warm, supple, and dry.  Color, texture, and turgor of skin within normal limits.    Left hallux  nail is painful on the medial border.  Mild irritation of the medial nail fold is noted.  No significant redness, swelling or drainage noted. No sign of infection seen.    Assessment     ICD-10-CM   1. Ingrown left greater toenail  L60.0        Plan  Treatment options and alternatives were discussed. Recommended a permanent removal of the medial nail border of the left hallux toenail.  Patient agreed. Skin was prepped with alcohol and a local injection of lidocaine and Marcaine plain was infiltrated to anesthetize the toe. The toe was then prepped with Betadine and exsanguinated. The offending nail border was removed and phenol applied to the exposed matrix tissue.  The area was then cleansed  well with alcohol.  Antibiotic ointment and a dressing was then applied the tourniquet released  noting a prompt hyperemic response to the tip of the toe.  Oral and written instructions were dispensed and the patient was instructed on aftercare.  If there is any increased redness, swelling, drainage, pus or any other concerns arise, [facility] will call to be seen.

## 2021-11-30 NOTE — Patient Instructions (Signed)
Soak Instructions    THE DAY AFTER THE PROCEDURE  Place 1/4 cup of epsom salts in a quart of warm tap water.  Submerge your foot or feet with outer bandage intact for the initial soak; this will allow the bandage to become moist and wet for easy lift off.  Once you remove your bandage, continue to soak in the solution for 20 minutes.  This soak should be done twice a day.  Next, remove your foot or feet from solution, blot dry the affected area and cover.  Apply polysporin or neosporin to the side of the toenail that was removed.   You may use a band aid large enough to cover the area or use gauze and tape.     IF YOUR SKIN BECOMES IRRITATED WHILE USING THESE INSTRUCTIONS, IT IS OKAY TO SWITCH TO  antibacterial soap pump soap (Dial)  and water to keep the toe clean instead of soaking in epsom salts.    [place] Instructions-Post Nail Surgery  You have had your ingrown toenail and root treated with a chemical.  This chemical causes a burn that will drain and ooze like a blister.  1-2 weeks after the procedure you may leave the area open to air at night to help dry it up.  During the day,  It is important to keep this area clean and covered until the toe dries out and forms a scab. Once the scab forms you no longer need to soak or apply a dressing.  If at any time you experience an increase in pain, redness, swelling, or drainage, you should contact the office as soon as possible.

## 2021-12-01 ENCOUNTER — Telehealth: Payer: Self-pay | Admitting: Cardiology

## 2021-12-01 ENCOUNTER — Encounter (HOSPITAL_COMMUNITY): Payer: Self-pay | Admitting: Cardiology

## 2021-12-01 ENCOUNTER — Encounter (HOSPITAL_COMMUNITY)
Admission: AD | Disposition: A | Discharge status: Home / Self Care | Payer: Self-pay | Source: Other Acute Inpatient Hospital | Attending: Cardiology

## 2021-12-01 ENCOUNTER — Inpatient Hospital Stay (HOSPITAL_COMMUNITY)
Admission: AD | Admit: 2021-12-01 | Discharge: 2021-12-02 | DRG: 287 | Disposition: A | Discharge status: Home / Self Care | Payer: 59 | Source: Other Acute Inpatient Hospital | Attending: Cardiology | Admitting: Cardiology

## 2021-12-01 DIAGNOSIS — I25810 Atherosclerosis of coronary artery bypass graft(s) without angina pectoris: Secondary | ICD-10-CM | POA: Diagnosis present

## 2021-12-01 DIAGNOSIS — E785 Hyperlipidemia, unspecified: Secondary | ICD-10-CM | POA: Diagnosis present

## 2021-12-01 DIAGNOSIS — Z87891 Personal history of nicotine dependence: Secondary | ICD-10-CM | POA: Diagnosis not present

## 2021-12-01 DIAGNOSIS — K219 Gastro-esophageal reflux disease without esophagitis: Secondary | ICD-10-CM | POA: Diagnosis present

## 2021-12-01 DIAGNOSIS — R079 Chest pain, unspecified: Secondary | ICD-10-CM | POA: Diagnosis present

## 2021-12-01 DIAGNOSIS — I2510 Atherosclerotic heart disease of native coronary artery without angina pectoris: Secondary | ICD-10-CM | POA: Diagnosis present

## 2021-12-01 DIAGNOSIS — I10 Essential (primary) hypertension: Secondary | ICD-10-CM | POA: Diagnosis present

## 2021-12-01 DIAGNOSIS — I2582 Chronic total occlusion of coronary artery: Secondary | ICD-10-CM | POA: Diagnosis present

## 2021-12-01 DIAGNOSIS — Z79899 Other long term (current) drug therapy: Secondary | ICD-10-CM | POA: Diagnosis not present

## 2021-12-01 DIAGNOSIS — M1990 Unspecified osteoarthritis, unspecified site: Secondary | ICD-10-CM | POA: Diagnosis present

## 2021-12-01 DIAGNOSIS — Z720 Tobacco use: Secondary | ICD-10-CM

## 2021-12-01 DIAGNOSIS — Z7982 Long term (current) use of aspirin: Secondary | ICD-10-CM | POA: Diagnosis not present

## 2021-12-01 DIAGNOSIS — I252 Old myocardial infarction: Secondary | ICD-10-CM

## 2021-12-01 DIAGNOSIS — Z955 Presence of coronary angioplasty implant and graft: Secondary | ICD-10-CM | POA: Diagnosis not present

## 2021-12-01 DIAGNOSIS — R072 Precordial pain: Principal | ICD-10-CM | POA: Diagnosis present

## 2021-12-01 DIAGNOSIS — Z791 Long term (current) use of non-steroidal anti-inflammatories (NSAID): Secondary | ICD-10-CM

## 2021-12-01 DIAGNOSIS — I739 Peripheral vascular disease, unspecified: Secondary | ICD-10-CM | POA: Diagnosis not present

## 2021-12-01 DIAGNOSIS — I25700 Atherosclerosis of coronary artery bypass graft(s), unspecified, with unstable angina pectoris: Secondary | ICD-10-CM | POA: Diagnosis not present

## 2021-12-01 HISTORY — PX: LEFT HEART CATH AND CORS/GRAFTS ANGIOGRAPHY: CATH118250

## 2021-12-01 SURGERY — LEFT HEART CATH AND CORS/GRAFTS ANGIOGRAPHY
Anesthesia: LOCAL

## 2021-12-01 MED ORDER — SODIUM CHLORIDE 0.9 % WEIGHT BASED INFUSION: 1.0000 mL/kg/h | INTRAVENOUS | Status: AC

## 2021-12-01 MED ORDER — HEPARIN (PORCINE) IN NACL 1000-0.9 UT/500ML-% IV SOLN
INTRAVENOUS | Status: AC
  Filled 2021-12-01: qty 1000

## 2021-12-01 MED ORDER — GABAPENTIN 300 MG PO CAPS
300.0000 mg | ORAL_CAPSULE | Freq: Three times a day (TID) | ORAL | Status: DC
  Administered 2021-12-01 – 2021-12-02 (×3): 300 mg via ORAL
  Filled 2021-12-01 (×3): qty 1

## 2021-12-01 MED ORDER — HEPARIN SODIUM (PORCINE) 1000 UNIT/ML IJ SOLN
INTRAMUSCULAR | Status: DC | PRN
  Administered 2021-12-01: 3500 [IU] via INTRAVENOUS

## 2021-12-01 MED ORDER — METOPROLOL TARTRATE 100 MG PO TABS
100.0000 mg | ORAL_TABLET | Freq: Every day | ORAL | Status: DC
  Administered 2021-12-02: 100 mg via ORAL
  Filled 2021-12-01: qty 1

## 2021-12-01 MED ORDER — SODIUM CHLORIDE 0.9 % IV SOLN: 250.0000 mL | INTRAVENOUS | Status: DC | PRN

## 2021-12-01 MED ORDER — SODIUM CHLORIDE 0.9 % WEIGHT BASED INFUSION: 1.0000 mL/kg/h | INTRAVENOUS | Status: DC

## 2021-12-01 MED ORDER — HEPARIN SODIUM (PORCINE) 1000 UNIT/ML IJ SOLN
INTRAMUSCULAR | Status: AC
  Filled 2021-12-01: qty 10

## 2021-12-01 MED ORDER — SODIUM CHLORIDE 0.9% FLUSH
3.0000 mL | Freq: Two times a day (BID) | INTRAVENOUS | Status: DC
  Administered 2021-12-02 (×2): 3 mL via INTRAVENOUS

## 2021-12-01 MED ORDER — ACETAMINOPHEN 325 MG PO TABS
650.0000 mg | ORAL_TABLET | ORAL | Status: DC | PRN
Start: 2021-12-01 — End: 2021-12-02

## 2021-12-01 MED ORDER — LIDOCAINE HCL (PF) 1 % IJ SOLN
INTRAMUSCULAR | Status: DC | PRN
  Administered 2021-12-01: 2 mL

## 2021-12-01 MED ORDER — VERAPAMIL HCL 2.5 MG/ML IV SOLN
INTRAVENOUS | Status: AC
  Filled 2021-12-01: qty 2

## 2021-12-01 MED ORDER — SODIUM CHLORIDE 0.9% FLUSH: 3.0000 mL | INTRAVENOUS | Status: DC | PRN

## 2021-12-01 MED ORDER — HEPARIN (PORCINE) IN NACL 1000-0.9 UT/500ML-% IV SOLN
INTRAVENOUS | Status: DC | PRN
  Administered 2021-12-01 (×2): 500 mL

## 2021-12-01 MED ORDER — VERAPAMIL HCL 2.5 MG/ML IV SOLN
INTRAVENOUS | Status: DC | PRN
  Administered 2021-12-01: 10 mL via INTRA_ARTERIAL

## 2021-12-01 MED ORDER — FENTANYL CITRATE (PF) 100 MCG/2ML IJ SOLN
INTRAMUSCULAR | Status: AC
  Filled 2021-12-01: qty 2

## 2021-12-01 MED ORDER — ISOSORBIDE DINITRATE 20 MG PO TABS: 20.0000 mg | ORAL_TABLET | Freq: Every day | ORAL | Status: DC

## 2021-12-01 MED ORDER — MIDAZOLAM HCL 2 MG/2ML IJ SOLN
INTRAMUSCULAR | Status: AC
  Filled 2021-12-01: qty 2

## 2021-12-01 MED ORDER — SODIUM CHLORIDE 0.9% FLUSH
3.0000 mL | Freq: Two times a day (BID) | INTRAVENOUS | Status: DC
  Administered 2021-12-01: 3 mL via INTRAVENOUS

## 2021-12-01 MED ORDER — SODIUM CHLORIDE 0.9 % WEIGHT BASED INFUSION: 3.0000 mL/kg/h | INTRAVENOUS | Status: DC

## 2021-12-01 MED ORDER — ASPIRIN 81 MG PO CHEW
81.0000 mg | CHEWABLE_TABLET | Freq: Two times a day (BID) | ORAL | Status: DC
  Administered 2021-12-01 – 2021-12-02 (×2): 81 mg via ORAL
  Filled 2021-12-01 (×2): qty 1

## 2021-12-01 MED ORDER — FENTANYL CITRATE (PF) 100 MCG/2ML IJ SOLN
INTRAMUSCULAR | Status: DC | PRN
  Administered 2021-12-01: 25 ug via INTRAVENOUS

## 2021-12-01 MED ORDER — ASPIRIN 81 MG PO CHEW: 81.0000 mg | CHEWABLE_TABLET | ORAL | Status: DC

## 2021-12-01 MED ORDER — ISOSORBIDE DINITRATE 20 MG PO TABS
20.0000 mg | ORAL_TABLET | Freq: Every day | ORAL | Status: DC
  Administered 2021-12-02: 20 mg via ORAL
  Filled 2021-12-01: qty 1

## 2021-12-01 MED ORDER — LIDOCAINE HCL (PF) 1 % IJ SOLN
INTRAMUSCULAR | Status: AC
  Filled 2021-12-01: qty 30

## 2021-12-01 MED ORDER — ONDANSETRON HCL 4 MG/2ML IJ SOLN: 4.0000 mg | Freq: Four times a day (QID) | INTRAMUSCULAR | Status: DC | PRN

## 2021-12-01 MED ORDER — PANTOPRAZOLE SODIUM 40 MG PO TBEC
40.0000 mg | DELAYED_RELEASE_TABLET | Freq: Every day | ORAL | Status: DC
  Administered 2021-12-02: 40 mg via ORAL
  Filled 2021-12-01: qty 1

## 2021-12-01 MED ORDER — PRAVASTATIN SODIUM 40 MG PO TABS: 40.0000 mg | ORAL_TABLET | Freq: Every day | ORAL | Status: DC

## 2021-12-01 MED ORDER — MIDAZOLAM HCL 2 MG/2ML IJ SOLN
INTRAMUSCULAR | Status: DC | PRN
  Administered 2021-12-01: 2 mg via INTRAVENOUS

## 2021-12-01 MED ORDER — IOHEXOL 350 MG/ML SOLN
INTRAVENOUS | Status: DC | PRN
  Administered 2021-12-01: 75 mL

## 2021-12-01 SURGICAL SUPPLY — 12 items
BAND CMPR LRG ZPHR (HEMOSTASIS) ×1
BAND ZEPHYR COMPRESS 30 LONG (HEMOSTASIS) ×1 IMPLANT
CATH INFINITI 5 FR IM (CATHETERS) ×1 IMPLANT
CATH INFINITI 5FR AL1 (CATHETERS) ×1 IMPLANT
CATH INFINITI 5FR MULTPACK ANG (CATHETERS) ×1 IMPLANT
GLIDESHEATH SLEND SS 6F .021 (SHEATH) ×1 IMPLANT
GUIDEWIRE INQWIRE 1.5J.035X260 (WIRE) IMPLANT
INQWIRE 1.5J .035X260CM (WIRE) ×2
KIT HEART LEFT (KITS) ×3 IMPLANT
PACK CARDIAC CATHETERIZATION (CUSTOM PROCEDURE TRAY) ×3 IMPLANT
TRANSDUCER W/STOPCOCK (MISCELLANEOUS) ×3 IMPLANT
TUBING CIL FLEX 10 FLL-RA (TUBING) ×3 IMPLANT

## 2021-12-01 NOTE — Telephone Encounter (Signed)
error 

## 2021-12-01 NOTE — H&P (Signed)
Cardiology Admission History and Physical:   Patient ID: Stephen Walsh MRN: [id number]; DOB: 1970-05-24   Admission date: 12/01/2021  PCP:  Martin Majestic, [place] Providers Cardiologist: New to Dr. Martinique, will see Dr. Agustin Cree   Chief Complaint: Chest pain  Patient Profile:   Stephen Walsh is a 51 y.o. male with past medical history of CAD status post CABG 2013, PAD, hyperlipidemia, hypertension, tobacco abuse, who is being seen 12/01/2021 for the evaluation of chest pain.  History of Present Illness:   Stephen Walsh with above past medical history presented to [facility] with chest pain.   He follows Dr. Otho Perl at [facility], last seen 08/29/2021.  Reportedly he had a inferior MI in 2011 s/p RCA stent and history of CABG in 2013 with LIMA to LAD, SVG's to diagonal and OM .  Last cardiac catheterization was completed at [place] 10/31/2017 revealed severe LAD lesion mild to moderate disease elsewhere; patent RCA stent; SVG to Diag, SVG to Ramus/OM are occluded; patent LIMA to LAD stent.  LV function was normal.  He had mentioned some chest discomfort that was different from his previous anginal chest pain,  ischemic evaluation was discussed, patient preferred to watch clinically and he has no insurance cover [facility] providers at the time.   Patient is in the process of transitioning cardiology care to Dr. Agustin Cree.  He has reported long history of exertional angina, he describes chest pressure triggered by exertional activity, and relieved by resting.  Over the past 1 week, he felt his symptoms are accelerated, minor activity would trigger symptoms, pain would relief with nitroglycerin.  He states he is taking Cialis, has not been taking metoprolol or isosorbide for few month.    At outside facility, his diagnostic work-up from 11/30/2021 revealed negative troponin x2.  WBC 10 400, hemoglobin 13.9.  Platelet 168 K.  Sodium 137.  Potassium 4.   Creatinine 0.9.  proBNP 140.  Magnesium 1.9.INR 1.   Lipid panel was obtained on 12/01/2021 revealed triglycerides 78, LDL 93.4, and HDL 39.  CTA of chest revealed no acute pulmonary embolism.  He was eval by Dr. Bettina Gavia, was concerned for unstable angina, query if he is a candidate of redo bypass, therefore transferred to [facility] for cardiac catheterization today.     Past Medical History:  Diagnosis Date   Alcohol abuse    16 oz cans-up to 18 cans per day   Anemia    Anxiety    Arthritis    Coronary artery disease    Depression    GERD (gastroesophageal reflux disease)    History of heart attack 2010   Hx of CABG 2012   Hyperlipidemia    Hypertension    Myocardial infarction [facility])     Past Surgical History:  Procedure Laterality Date   APPENDECTOMY     BACK SURGERY  04/07/2019   [place] [place] GRAFT  2012   KNEE ARTHROCENTESIS     right nee   TONSILECTOMY, ADENOIDECTOMY, BILATERAL MYRINGOTOMY AND TUBES       Medications Prior to Admission: Prior to Admission medications   Medication Sig Start Date End Date Taking? Authorizing Provider  aspirin 81 MG chewable tablet Chew 81 mg by mouth 2 (two) times daily.    [provider]  gabapentin (NEURONTIN) 300 MG capsule Take 300 mg by mouth 3 (three) times daily.    [provider]  isosorbide dinitrate (ISORDIL) 20 MG tablet Take 20 mg by mouth daily.    [provider]  meloxicam (MOBIC) 15 MG tablet Take 15 mg by mouth daily.    [provider]  metoprolol tartrate (LOPRESSOR) 100 MG tablet Take 100 mg by mouth daily.    [provider]  omeprazole (PRILOSEC) 20 MG capsule Take 1 capsule (20 mg total) by mouth daily. 05/15/19   Cirigliano, Vito V, DO  omeprazole (PRILOSEC) 20 MG capsule Take 1 capsule (20 mg total) by mouth 2 (two) times daily before a meal. 07/15/19   Cirigliano, Vito V, DO  oxyCODONE (OXY IR/ROXICODONE) 5 MG immediate release  tablet Take 5 mg by mouth 4 (four) times daily as needed. 04/14/19   [provider]  pravastatin (PRAVACHOL) 40 MG tablet Take 40 mg by mouth daily.    [provider]  tadalafil (CIALIS) 20 MG tablet Take 20 mg by mouth daily as needed for erectile dysfunction.    [provider]     Allergies:   No Known Allergies  Social History:   Social History   Socioeconomic History   Marital status: Married    Spouse name: Anne Ng   Number of children: 1   Years of education: 9   Highest education level: 9th grade  Occupational History   Occupation: factory  Tobacco Use   Smoking status: Former    Packs/day: 1.00    Types: Cigarettes    Start date: 1978    Quit date: 03/08/2019    Years since quitting: 2.7   Smokeless tobacco: Former    Types: Chew    Quit date: 1982  Vaping Use   Vaping Use: Never used  Substance and Sexual Activity   Alcohol use: Not Currently   Drug use: Not Currently   Sexual activity: Yes  Other Topics Concern   Not on file  Social History Narrative   Not on file   Social Determinants of Health   Financial Resource Strain: Not on file  Food Insecurity: Not on file  Transportation Needs: Not on file  Physical Activity: Not on file  Stress: Not on file  Social Connections: Not on file  Intimate Partner Violence: Not on file    Family History:   The patient's family history is negative for Colon cancer, Esophageal cancer, Rectal cancer, and Stomach cancer.    ROS:  Constitutional: Denied fever, chills, malaise, night sweats Eyes: Denied vision change or loss Ears/Nose/Mouth/Throat: Denied ear ache, sore throat, coughing, sinus pain Cardiovascular: see HPI  Respiratory: see HPI  Gastrointestinal: Denied nausea, vomiting, abdominal pain, diarrhea Genital/Urinary: Denied dysuria, hematuria, urinary frequency/urgency Musculoskeletal: Denied muscle ache, joint pain, weakness Skin: Denied rash, wound Neuro: Denied headache,  dizziness, syncope Psych: Denied history of depression/anxiety  Endocrine: Denied history of diabetes     Physical Exam/Data:   Vitals:   12/01/21 1634 12/01/21 1639 12/01/21 1644 12/01/21 1649  BP: 124/75 125/80 120/77 123/82  Pulse: 73 66 67 60  Resp: (!) 21 (!) 21 15 (!) 22  SpO2: 94% 95% 96% 99%   No intake or output data in the 24 hours ending 12/01/21 1705    05/15/2019    7:25 AM 04/21/2019    1:51 PM 02/11/2019    2:54 PM  Last 3 Weights  Weight (lbs) 207 lb 207 lb 8 oz 194 lb 6.4 oz  Weight (kg) 93.895 kg 94.121 kg 88.179 kg     There is no height or weight on  file to calculate BMI.   Vitals:  Vitals:   12/01/21 1649 12/01/21 1718  BP: 123/82   Pulse: 60 62  Resp: (!) 22   Temp:  97.9 F (36.6 C)  SpO2: 99%    General Appearance: In no apparent distress, laying in bed HEENT: Normocephalic, atraumatic.  Neck: Supple, trachea midline, no JVDs Cardiovascular: Regular rate and rhythm, normal S1-S2,  no murmur Respiratory: Resting breathing unlabored, lungs sounds clear to auscultation bilaterally, no use of accessory muscles. On room air.  No wheezes, rales or rhonchi.   Gastrointestinal: Bowel sounds positive, abdomen soft Extremities: Able to move all extremities in bed without difficulty, no edema Musculoskeletal: Normal muscle bulk and tone Skin: Intact, warm, dry. No rashes or petechiae noted in exposed areas.  Neurologic: Alert, oriented to person, place and time. Fluent speech, no facial droop, no cognitive deficit Psychiatric: Normal affect. Mood is appropriate.     EKG:  The ECG that was done at outside facility was personally reviewed and demonstrates sinus rhythm   Relevant CV Studies: N/A  Laboratory Data:  High Sensitivity Troponin:  No results for input(s): "TROPONINIHS" in the last 720 hours.    ChemistryNo results for input(s): "NA", "K", "CL", "CO2", "GLUCOSE", "BUN", "CREATININE", "CALCIUM", "MG", "GFRNONAA", "GFRAA", "ANIONGAP" in the  last 168 hours.  No results for input(s): "PROT", "ALBUMIN", "AST", "ALT", "ALKPHOS", "BILITOT" in the last 168 hours. Lipids No results for input(s): "CHOL", "TRIG", "HDL", "LABVLDL", "LDLCALC", "CHOLHDL" in the last 168 hours. HematologyNo results for input(s): "WBC", "RBC", "HGB", "HCT", "MCV", "MCH", "MCHC", "RDW", "PLT" in the last 168 hours. Thyroid No results for input(s): "TSH", "FREET4" in the last 168 hours. BNPNo results for input(s): "BNP", "PROBNP" in the last 168 hours.  DDimer No results for input(s): "DDIMER" in the last 168 hours.   Radiology/Studies:  CARDIAC CATHETERIZATION  Result Date: 12/01/2021   Ost LM lesion is 30% stenosed.   Mid LAD lesion is 100% stenosed.   1st Mrg lesion is 40% stenosed.   2nd Mrg lesion is 30% stenosed.   Prox RCA to Mid RCA lesion is 35% stenosed.   Dist RCA lesion is 35% stenosed.   Origin lesion is 100% stenosed.   Origin to Prox Graft lesion is 100% stenosed.   LIMA graft was visualized by angiography and is normal in caliber.   SVG graft was visualized by angiography.   SVG graft was visualized by angiography.   The graft exhibits no disease.   The left ventricular systolic function is normal.   LV end diastolic pressure is normal.   The left ventricular ejection fraction is 55-65% by visual estimate. Single vessel occlusive CAD involving the mid LAD Patent LIMA to the LAD Patent stent in the distal RCA Occluded SVG to OM and SVG to diagonal Normal LV function Normal LVEDP Plan: consider other causes of chest pain.     Assessment and Plan:   Unstable angina CAD with history of CABG 2013 - SVG to Ramus/OM are occluded known from cath 10/2017 - Hs trop negative x2, no acute EKG change  -Transferred from [facility] to [facility] for cardiac catheterization, will be performed by Dr. Martinique, see cath report for detail -Will admit to telemetry unit, medical management recommendation pending cath report -Continue home meds aspirin, resume  home meds metoprolol, isosorbide dinitrate (stopped taking for a few month), pravastatin for now  Hypertension -On lisinopril, isosorbide dinitrate, metoprolol at home; hold lisinopril given borderline low BP now   Hyperlipidemia -  home med Pravastatin resumed  Tobacco abuse -Recommended smoking cessation   Risk Assessment/Risk Scores:     HEAR Score (for undifferentiated chest pain): Certificate not available   Severity of Illness: The appropriate patient status for this patient is OBSERVATION. Observation status is judged to be reasonable and necessary in order to provide the required intensity of service to ensure the patient's safety. The patient's presenting symptoms, physical exam findings, and initial radiographic and laboratory data in the context of their medical condition is felt to place them at decreased risk for further clinical deterioration. Furthermore, it is anticipated that the patient will be medically stable for discharge from the hospital within 2 midnights of admission.    For questions or updates, please contact [place] Please consult [email or web address] for contact info under     Signed, Margie Billet, NP  12/01/2021 5:05 PM

## 2021-12-01 NOTE — Progress Notes (Signed)
Received patient  from [facility] via [facility].  Alert and oriented, skin warm and dry, resp even and unlabored.  Pt denies any chest pain or discomfort at this time.  IVF started in right FA with NS, placed on bedside monitor, consent signed.  Pt waiting for cath procedure

## 2021-12-02 ENCOUNTER — Other Ambulatory Visit (HOSPITAL_COMMUNITY): Payer: Self-pay

## 2021-12-02 DIAGNOSIS — R072 Precordial pain: Secondary | ICD-10-CM

## 2021-12-02 MED ORDER — METOPROLOL SUCCINATE ER 50 MG PO TB24
50.0000 mg | ORAL_TABLET | Freq: Every day | ORAL | 0 refills | Status: DC
  Filled 2021-12-02: qty 90, 90d supply, fill #0

## 2021-12-02 NOTE — Discharge Summary (Addendum)
Discharge Summary    Patient ID: Stephen Walsh MRN: [id number]; DOB: [birth year]  Admit date: 12/01/2021 Discharge date: 12/02/2021  PCP:  Stephen Walsh, [place] Prov[facility] Cardiologist:  Stephen Campus, MD     Discharge Diagnoses    Principal Problem:   Precordial chest pain   Diagnostic Studies/Procedures    Cath: 12/01/21    Ost LM lesion is 30% stenosed.   Mid LAD lesion is 100% stenosed.   1st Mrg lesion is 40% stenosed.   2nd Mrg lesion is 30% stenosed.   Prox RCA to Mid RCA lesion is 35% stenosed.   Dist RCA lesion is 35% stenosed.   Origin lesion is 100% stenosed.   Origin to Prox Graft lesion is 100% stenosed.   LIMA graft was visualized by angiography and is normal in caliber.   SVG graft was visualized by angiography.   SVG graft was visualized by angiography.   The graft exhibits no disease.   The left ventricular systolic function is normal.   LV end diastolic pressure is normal.   The left ventricular ejection fraction is 55-65% by visual estimate.   Single vessel occlusive CAD involving the mid LAD Patent LIMA to the LAD Patent stent in the distal RCA Occluded SVG to OM and SVG to diagonal Normal LV function Normal LVEDP   Plan: consider other causes of chest pain.   Diagnostic Dominance: Right    _____________   History of Present Illness     Stephen Walsh is a 51 y.o. male with  past medical history of CAD status post CABG 2013, PAD, hyperlipidemia, hypertension, tobacco abuse, who was seen on 12/01/2021 for the evaluation of chest pain.  He follows Dr. Otho Walsh at [facility], last seen 08/29/2021.  Reportedly he had a inferior MI in 2011 s/p RCA stent and history of CABG in 2013 with LIMA to LAD, SVG's to diagonal and OM .  Last cardiac catheterization was completed at [facility] 10/31/2017 revealed severe LAD lesion mild to moderate disease elsewhere; patent RCA stent; SVG to Diag, SVG to Ramus/OM are occluded; patent  LIMA to LAD stent.  LV function was normal.  He had mentioned some chest discomfort that was different from his previous anginal chest pain,  ischemic evaluation was discussed, patient preferred to watch clinically and he has no insurance cover Stephen Walsh providers at the time.    Patient in the process of transitioning cardiology care to Dr. Agustin Walsh.  He has reported long history of exertional angina, he described chest pressure triggered by exertional activity, and relieved by resting.  Over the past 1 week prior to admission, he felt his symptoms were accelerated, minor activity would trigger symptoms, pain would relief with nitroglycerin.   He was taking Cialis, has not been taking metoprolol or isosorbide for few month.    At outside facility, his diagnostic work-up from 11/30/2021 revealed negative troponin x2.  WBC 10 400, hemoglobin 13.9.  Platelet 168 K.  Sodium 137.  Potassium 4.  Creatinine 0.9.  proBNP 140.  Magnesium 1.9.INR 1.   Lipid panel was obtained on 12/01/2021 revealed triglycerides 78, LDL 93.4, and HDL 39.  CTA of chest revealed no acute pulmonary embolism.  He was eval by Stephen Walsh, was concerned for unstable angina, query if he was a candidate of redo bypass, therefore transferred to Stephen Walsh for cardiac catheterization.   Walsh Course      Underwent cardiac catheterization noted above with single-vessel occlusive  CAD involving the mid LAD, patent LIMA to LAD, patent stent to distal RCA with known occlusions of SVG to OM and SVG to diagonal.  Normal LVEDP and LV function via LV gram noted.  Recommendations to continue medical therapy, consider other etiologies of chest pain.  No recurrent chest pain.  He was continued on aspirin, pravastatin, and resumed on Toprol '50mg'$  daily.   Patient was seen by Stephen Walsh and deemed stable for discharge.  Follow-up to be arranged in the Stephen Walsh office.  Did the patient have an acute coronary syndrome (MI, NSTEMI, STEMI, etc) this  admission?:  No                               Did the patient have a percutaneous coronary intervention (stent / angioplasty)?:  No.   ____________  Discharge Vitals Blood pressure 122/83, pulse 61, temperature 97.7 F (36.5 C), temperature source Oral, resp. rate 14, height '5\' 11"'$  (1.803 m), weight 73 kg, SpO2 96 %.  Filed Weights   12/01/21 2200  Weight: 73 kg    Labs & Radiologic Studies    CBC No results for input(s): "WBC", "NEUTROABS", "HGB", "HCT", "MCV", "PLT" in the last 72 hours. Basic Metabolic Panel No results for input(s): "NA", "K", "CL", "CO2", "GLUCOSE", "BUN", "CREATININE", "CALCIUM", "MG", "PHOS" in the last 72 hours. Liver Function Tests No results for input(s): "AST", "ALT", "ALKPHOS", "BILITOT", "PROT", "ALBUMIN" in the last 72 hours. No results for input(s): "LIPASE", "AMYLASE" in the last 72 hours. High Sensitivity Troponin:   No results for input(s): "TROPONINIHS" in the last 720 hours.  BNP Invalid input(s): "POCBNP" D-Dimer No results for input(s): "DDIMER" in the last 72 hours. Hemoglobin A1C No results for input(s): "HGBA1C" in the last 72 hours. Fasting Lipid Panel No results for input(s): "CHOL", "HDL", "LDLCALC", "TRIG", "CHOLHDL", "LDLDIRECT" in the last 72 hours. Thyroid Function Tests No results for input(s): "TSH", "T4TOTAL", "T3FREE", "THYROIDAB" in the last 72 hours.  Invalid input(s): "FREET3" _____________  CARDIAC CATHETERIZATION  Result Date: 12/01/2021   Ost LM lesion is 30% stenosed.   Mid LAD lesion is 100% stenosed.   1st Mrg lesion is 40% stenosed.   2nd Mrg lesion is 30% stenosed.   Prox RCA to Mid RCA lesion is 35% stenosed.   Dist RCA lesion is 35% stenosed.   Origin lesion is 100% stenosed.   Origin to Prox Graft lesion is 100% stenosed.   LIMA graft was visualized by angiography and is normal in caliber.   SVG graft was visualized by angiography.   SVG graft was visualized by angiography.   The graft exhibits no disease.   The  left ventricular systolic function is normal.   LV end diastolic pressure is normal.   The left ventricular ejection fraction is 55-65% by visual estimate. Single vessel occlusive CAD involving the mid LAD Patent LIMA to the LAD Patent stent in the distal RCA Occluded SVG to OM and SVG to diagonal Normal LV function Normal LVEDP Plan: consider other causes of chest pain.    Disposition   Pt is being discharged home today in good condition.  Follow-up Plans & Appointments     Follow-up Information     Park Liter, MD Follow up.   Specialty: Cardiology Why: Office will call you with a follow up visit in the next 2-3 business days Contact information: [address] [place] [address] [phone]  Discharge Instructions     Call MD for:  difficulty breathing, headache or visual disturbances   Complete by: As directed    Call MD for:  persistant dizziness or light-headedness   Complete by: As directed    Call MD for:  redness, tenderness, or signs of infection (pain, swelling, redness, odor or green/yellow discharge around incision site)   Complete by: As directed    Diet - low sodium heart healthy   Complete by: As directed    Discharge instructions   Complete by: As directed    Radial Site Care Refer to this sheet in the next few weeks. These instructions provide you with information on caring for yourself after your procedure. Your caregiver may also give you more specific instructions. Your treatment has been planned according to current medical practices, but problems sometimes occur. Call your caregiver if you have any problems or questions after your procedure. HOME CARE INSTRUCTIONS You may shower the day after the procedure. Remove the bandage (dressing) and gently wash the site with plain soap and water. Gently pat the site dry.  Do not apply powder or lotion to the site.  Do not submerge the affected site in water for 3 to 5 days.  Inspect  the site at least twice daily.  Do not flex or bend the affected arm for 24 hours.  No lifting over 5 pounds (2.3 kg) for 5 days after your procedure.  Do not drive home if you are discharged the same day of the procedure. Have someone else drive you.  You may drive 24 hours after the procedure unless otherwise instructed by your caregiver.  What to expect: Any bruising will usually fade within 1 to 2 weeks.  Blood that collects in the tissue (hematoma) may be painful to the touch. It should usually decrease in size and tenderness within 1 to 2 weeks.  SEEK IMMEDIATE MEDICAL CARE IF: You have unusual pain at the radial site.  You have redness, warmth, swelling, or pain at the radial site.  You have drainage (other than a small amount of blood on the dressing).  You have chills.  You have a fever or persistent symptoms for more than 72 hours.  You have a fever and your symptoms suddenly get worse.  Your arm becomes pale, cool, tingly, or numb.  You have heavy bleeding from the site. Hold pressure on the site.   Increase activity slowly   Complete by: As directed        Discharge Medications   Allergies as of 12/02/2021   No Known Allergies      Medication List     STOP taking these medications    isosorbide dinitrate 20 MG tablet Commonly known as: ISORDIL   metoprolol tartrate 100 MG tablet Commonly known as: LOPRESSOR   omeprazole 20 MG capsule Commonly known as: PRILOSEC   omeprazole 40 MG capsule Commonly known as: PRILOSEC   oxyCODONE 5 MG immediate release tablet Commonly known as: Oxy IR/ROXICODONE       TAKE these medications    aspirin 81 MG chewable tablet Chew 81 mg by mouth 2 (two) times daily.   gabapentin 600 MG tablet Commonly known as: NEURONTIN Take 600 mg by mouth every 4 (four) hours. What changed: Another medication with the same name was removed. Continue taking this medication, and follow the directions you see here.   lisinopril 10  MG tablet Commonly known as: ZESTRIL Take 10 mg by mouth daily.  Magnesium 250 MG Tabs Take 250 mg by mouth in the morning.   meloxicam 15 MG tablet Commonly known as: MOBIC Take 15 mg by mouth daily.   metoprolol succinate 50 MG 24 hr tablet Commonly known as: Toprol XL Take 1 tablet (50 mg total) by mouth daily. Take with or immediately following a meal.   pravastatin 40 MG tablet Commonly known as: PRAVACHOL Take 40 mg by mouth daily.   tadalafil 10 MG tablet Commonly known as: CIALIS Take 10 mg by mouth daily as needed for erectile dysfunction.   tamsulosin 0.4 MG Caps capsule Commonly known as: FLOMAX Take 0.4 mg by mouth daily.           Outstanding Labs/Studies   N/a   Duration of Discharge Encounter   Greater than 30 minutes including physician time.  Signed, Reino Bellis, NP 12/02/2021, 10:57 AM    Personally seen and examined. Agree with above.  GEN: Well nourished, well developed, in no acute distress HEENT: normal Neck: no JVD, carotid bruits, or masses Cardiac: RRR; no murmurs, rubs, or gallops,no edema, cardiac catheterization site clean dry and intact, left radial Respiratory:  clear to auscultation bilaterally, normal work of breathing GI: soft, nontender, nondistended, + BS MS: no deformity or atrophy Skin: warm and dry, no rash Neuro:  Alert and Oriented x 3, Strength and sensation are intact Psych: euthymic mood, full affect  Ready to go home.  Okay for discharge.  Medications reviewed above.  He states that he has not been taking his metoprolol tartrate 100 mg twice a day at home.  We will give him metoprolol succinate 50 mg daily.  Heart rate here is 56 after getting a dose of tartrate 100.  Cardiac catheterization resulted in medical management.  Continue with isosorbide, metoprolol.  He is relieved.  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  Candee Furbish, MD

## 2021-12-03 LAB — LIPOPROTEIN A (LPA): Lipoprotein (a): 33.5 nmol/L — ABNORMAL HIGH (ref <75.0)

## 2021-12-05 ENCOUNTER — Telehealth: Payer: Self-pay | Admitting: Cardiology

## 2021-12-05 NOTE — Telephone Encounter (Signed)
Spoke with ps wife. She wanted to know when pt should return to work. His appt is 8-10 for follow up with Dr. Agustin Cree. Advised to see Dr. Agustin Cree at appt to discuss return to work. She agreed and had no further questions.

## 2021-12-05 NOTE — Telephone Encounter (Signed)
Patient's spouse called and wanted to ask about his LOA and also if he should be taking isosorbide or not

## 2021-12-06 ENCOUNTER — Ambulatory Visit: Payer: BC Managed Care – PPO | Admitting: Cardiology

## 2021-12-12 ENCOUNTER — Telehealth: Payer: Self-pay | Admitting: Cardiology

## 2021-12-12 NOTE — Telephone Encounter (Signed)
Pt wife called back again stating that she wants to know if pt can get a sign from Dr. Agustin Cree for him to return back to work because his f/u on 12/22/21.

## 2021-12-12 NOTE — Telephone Encounter (Signed)
Patient's wife calling back. She states she thinks she missed a call.

## 2021-12-12 NOTE — Telephone Encounter (Signed)
Spoke with pts wife per DPR- She needed a letter stating that the pt would be out of work until his follow up date. Letter completed and left up front for pt to pick up.

## 2021-12-13 ENCOUNTER — Ambulatory Visit: Payer: 59 | Admitting: Podiatrist

## 2021-12-22 ENCOUNTER — Encounter: Payer: Self-pay | Admitting: Cardiology

## 2021-12-22 ENCOUNTER — Ambulatory Visit (INDEPENDENT_AMBULATORY_CARE_PROVIDER_SITE_OTHER): Payer: Commercial Managed Care - HMO | Admitting: Cardiology

## 2021-12-22 VITALS — BP 120/83 | HR 61 | Ht 70.0 in | Wt 163.8 lb

## 2021-12-22 DIAGNOSIS — E785 Hyperlipidemia, unspecified: Secondary | ICD-10-CM | POA: Diagnosis not present

## 2021-12-22 DIAGNOSIS — I10 Essential (primary) hypertension: Secondary | ICD-10-CM

## 2021-12-22 DIAGNOSIS — Z951 Presence of aortocoronary bypass graft: Secondary | ICD-10-CM

## 2021-12-22 DIAGNOSIS — Z720 Tobacco use: Secondary | ICD-10-CM | POA: Diagnosis not present

## 2021-12-22 LAB — LDL CHOLESTEROL, DIRECT: LDL Direct: 92 mg/dL (ref 0–99)

## 2021-12-22 MED ORDER — NITROGLYCERIN 0.4 MG SL SUBL: 0.4000 mg | SUBLINGUAL_TABLET | SUBLINGUAL | 6 refills | Status: AC | PRN

## 2021-12-22 MED ORDER — RANOLAZINE ER 500 MG PO TB12: 500.0000 mg | ORAL_TABLET | Freq: Two times a day (BID) | ORAL | 3 refills | Status: DC

## 2021-12-22 MED ORDER — OMEPRAZOLE 40 MG PO CPDR: 40.0000 mg | DELAYED_RELEASE_CAPSULE | Freq: Every day | ORAL | 3 refills | Status: DC

## 2021-12-22 NOTE — Patient Instructions (Signed)
Medication Instructions:  Your physician has recommended you make the following change in your medication:   START: Ranolazine '500mg'$  1 twice daily by mouth  START: Omeprazole '40mg'$  1 tablet/capsule daily by mouth    Lab Work: LDL Direct -today If you have labs (blood work) drawn today and your tests are completely normal, you will receive your results only by: [place] (if you have MyChart) OR A paper copy in the mail If you have any lab test that is abnormal or we need to change your treatment, we will call you to review the results.   Testing/Procedures: None Ordered   Follow-Up: At [facility], you and your health needs are our priority.  As part of our continuing mission to provide you with exceptional heart care, we have created designated Provider Care Teams.  These Care Teams include your primary Cardiologist (physician) and Advanced Practice Providers (APPs -  Physician Assistants and Nurse Practitioners) who all work together to provide you with the care you need, when you need it.  We recommend signing up for the patient portal called "MyChart".  Sign up information is provided on this After Visit Summary.  MyChart is used to connect with patients for Virtual Visits (Telemedicine).  Patients are able to view lab/test results, encounter notes, upcoming appointments, etc.  Non-urgent messages can be sent to your provider as well.   To learn more about what you can do with MyChart, go to [email or web address].    Your next appointment:   1 month(s)  The format for your next appointment:   In Person  Provider:   Jenne Campus, MD    Other Instructions NA

## 2021-12-22 NOTE — Progress Notes (Signed)
Cardiology Office Note:    Date:  12/22/2021   ID:  Stephen Walsh, DOB 1970/12/04, MRN [id number]  PCP:  Stephen Majestic, FNP  Cardiologist:  Stephen Campus, MD    Referring MD: Stephen Walsh, *   Chief Complaint  Patient presents with   Chest Pain   Shortness of Breath    History of Present Illness:    Stephen Walsh is a 51 y.o. male with past medical history significant for coronary artery disease, status post coronary bypass graft in 2013, peripheral vascular disease, hyperlipidemia, essential hypertension, tobacco abuse.  Reportedly he did have inferior wall myocardial infarction 2011 status post RCA stent, history of coronary bypass grafting 2013 with LIMA to LAD SVG to diagonal and obtuse marginal branch that is already known to be occluded, apparently he did have cardiac catheterization in 2019 which revealed severe LAD lesion mid to moderate disease elsewhere.  Patent RCA stent, SVG to diagonal was ocludedSVG to ramus and obtuse marginal were occluded patent LIMA to LAD LV function was normal he was referred to [place] because of recurrent chest pain happening at lower level of exercises he eventually ended up being transferred to [facility] and cardiac catheterization has been performed cardiac catheterization basically showed the same changes that he was noted to have on his previous cardiac catheterization in 2019.  There were no target lesion for intervention.  He comes today to my office for follow-up He is still complaining of having chest pain.  That happen in different situations sometimes when he walks sometimes when he worked sometimes when he sits.  We did analyze cardiac catheterization together.  Past Medical History:  Diagnosis Date   Alcohol abuse    16 oz cans-up to 18 cans per day   Anemia    Anxiety    Arthritis    Coronary artery disease    Depression    GERD (gastroesophageal reflux disease)    History of heart attack 2010   Hx of CABG 2012    Hyperlipidemia    Hypertension    Myocardial infarction [facility])     Past Surgical History:  Procedure Laterality Date   APPENDECTOMY     BACK SURGERY  04/07/2019   [facility]    CORONARY ARTERY BYPASS GRAFT  2012   KNEE ARTHROCENTESIS     right nee   LEFT HEART CATH AND CORS/GRAFTS ANGIOGRAPHY N/A 12/01/2021   Procedure: LEFT HEART CATH AND CORS/GRAFTS ANGIOGRAPHY;  Surgeon: Martinique, Peter M, MD;  Location: [place] [facility];  Service: Cardiovascular;  Laterality: N/A;   TONSILECTOMY, ADENOIDECTOMY, BILATERAL MYRINGOTOMY AND TUBES      Current Medications: Current Meds  Medication Sig   aspirin 81 MG chewable tablet Chew 81 mg by mouth 2 (two) times daily.   gabapentin (NEURONTIN) 600 MG tablet Take 600 mg by mouth every 4 (four) hours.   lisinopril (ZESTRIL) 10 MG tablet Take 10 mg by mouth daily.   Magnesium 250 MG TABS Take 250 mg by mouth in the morning.   meloxicam (MOBIC) 15 MG tablet Take 15 mg by mouth daily.   metoprolol succinate (TOPROL XL) 50 MG 24 hr tablet Take 1 tablet (50 mg total) by mouth daily. Take with or immediately following a meal.   pravastatin (PRAVACHOL) 40 MG tablet Take 40 mg by mouth daily.   tadalafil (CIALIS) 10 MG tablet Take 10 mg by mouth daily as needed for erectile dysfunction.   tamsulosin (FLOMAX) 0.4 MG CAPS  capsule Take 0.4 mg by mouth daily.     Allergies:   Patient has no known allergies.   Social History   Socioeconomic History   Marital status: Married    Spouse name: Stephen Walsh   Number of children: 1   Years of education: 9   Highest education level: 9th grade  Occupational History   Occupation: factory  Tobacco Use   Smoking status: Former    Packs/day: 1.00    Types: Cigarettes    Start date: 1978    Quit date: 03/08/2019    Years since quitting: 2.7   Smokeless tobacco: Former    Types: Chew    Quit date: 1982  Vaping Use   Vaping Use: Never used  Substance and Sexual Activity   Alcohol use: Not  Currently   Drug use: Not Currently   Sexual activity: Yes  Other Topics Concern   Not on file  Social History Narrative   Not on file   Social Determinants of Health   Financial Resource Strain: Not on file  Food Insecurity: Not on file  Transportation Needs: Not on file  Physical Activity: Not on file  Stress: Not on file  Social Connections: Not on file     Family History: The patient's family history is negative for Colon cancer, Esophageal cancer, Rectal cancer, and Stomach cancer. ROS:   Please see the history of present illness.    All 14 point review of systems negative except as described per history of present illness  EKGs/Labs/Other Studies Reviewed:      Recent Labs: 10/14/2021: Creatinine, Ser 0.80  Recent Lipid Panel No results found for: "CHOL", "TRIG", "HDL", "CHOLHDL", "VLDL", "LDLCALC", "LDLDIRECT"  Physical Exam:    VS:  There were no vitals taken for this visit.    Wt Readings from Last 3 Encounters:  12/01/21 160 lb 15 oz (73 kg)  05/15/19 207 lb (93.9 kg)  04/21/19 207 lb 8 oz (94.1 kg)     GEN:  Well nourished, well developed in no acute distress HEENT: Normal NECK: No JVD; No carotid bruits LYMPHATICS: No lymphadenopathy CARDIAC: RRR, no murmurs, no rubs, no gallops RESPIRATORY:  Clear to auscultation without rales, wheezing or rhonchi  ABDOMEN: Soft, non-tender, non-distended MUSCULOSKELETAL:  No edema; No deformity  SKIN: Warm and dry LOWER EXTREMITIES: no swelling NEUROLOGIC:  Alert and oriented x 3 PSYCHIATRIC:  Normal affect   ASSESSMENT:    1. Hx of CABG   2. Essential hypertension   3. Dyslipidemia   4. Tobacco abuse    PLAN:    In order of problems listed above:  Coronary artery disease, status post coronary bypass graft, status post recent cardiac catheterization, no new findings, no target lesion identified for intervention in spite of that he still have recurrence of chest pain.  We had a long discussion what to do  about the situation.  I will put him on ranolazine 5 mg twice daily I also gave him new prescription for nitroglycerin thinking that he may have small small vessel disease.  I also have high level suspicion that may be GI related.  I will give him prescription for omeprazole as well as ask him to get Maalox and Mylanta in the pharmacy and try to use it.  I see him back in my office in about a month to 6 weeks and see how he does.  Until then we will excuse him from work.  Of course we did discuss history of smoking quit  strongly advised to quit.  I do not see his fasting lipid profile will do the test he is taking pravastatin which is only mild to moderate intensity statin at this level that he is taking.  We have he probably needs more aggressive management. Essential hypertension well-controlled Dyslipidemia plan as described above   Medication Adjustments/Labs and Tests Ordered: Current medicines are reviewed at length with the patient today.  Concerns regarding medicines are outlined above.  No orders of the defined types were placed in this encounter.  Medication changes: No orders of the defined types were placed in this encounter.   Signed, Park Liter, MD, [facility] 12/22/2021 8:57 AM    [place]

## 2022-01-12 ENCOUNTER — Telehealth: Payer: Self-pay

## 2022-01-12 MED ORDER — ATORVASTATIN CALCIUM 80 MG PO TABS: 80.0000 mg | ORAL_TABLET | Freq: Every day | ORAL | 3 refills | Status: DC

## 2022-01-12 NOTE — Telephone Encounter (Signed)
Results reviewed with pt as per Dr. Wendy Poet note. Pt agreed to increase dose of Lipitor to 80 mg daily. Sent to pharmacy. Pt verbalized understanding and had no additional questions. Routed to PCP

## 2022-01-24 ENCOUNTER — Encounter: Payer: Self-pay | Admitting: Cardiology

## 2022-01-24 ENCOUNTER — Ambulatory Visit: Payer: Commercial Managed Care - HMO | Attending: Cardiology | Admitting: Cardiology

## 2022-01-24 VITALS — BP 100/60 | HR 73 | Ht 70.0 in | Wt 163.2 lb

## 2022-01-24 DIAGNOSIS — Z951 Presence of aortocoronary bypass graft: Secondary | ICD-10-CM

## 2022-01-24 DIAGNOSIS — R072 Precordial pain: Secondary | ICD-10-CM | POA: Diagnosis not present

## 2022-01-24 DIAGNOSIS — I10 Essential (primary) hypertension: Secondary | ICD-10-CM | POA: Diagnosis not present

## 2022-01-24 DIAGNOSIS — I2510 Atherosclerotic heart disease of native coronary artery without angina pectoris: Secondary | ICD-10-CM | POA: Diagnosis not present

## 2022-01-24 DIAGNOSIS — E785 Hyperlipidemia, unspecified: Secondary | ICD-10-CM

## 2022-01-24 NOTE — Patient Instructions (Signed)

## 2022-01-24 NOTE — Progress Notes (Signed)
Cardiology Office Note:    Date:  01/24/2022   ID:  Stephen Walsh, DOB July 21, 1970, MRN [id number]  PCP:  Stephen Majestic, FNP  Cardiologist:  Stephen Campus, MD    Referring MD: Stephen Walsh, *   Chief Complaint  Patient presents with   Follow-up  Better  History of Present Illness:    Stephen Walsh is a 51 y.o. male   with past medical history significant for coronary artery disease, status post coronary bypass graft in 2013, peripheral vascular disease, hyperlipidemia, essential hypertension, tobacco abuse.  Reportedly he did have inferior wall myocardial infarction 2011 status post RCA stent, history of coronary bypass grafting 2013 with LIMA to LAD SVG to diagonal and obtuse marginal branch that is already known to be occluded, apparently he did have cardiac catheterization in 2019 which revealed severe LAD lesion mid to moderate disease elsewhere.  Patent RCA stent, SVG to diagonal was ocludedSVG to ramus and obtuse marginal were occluded patent LIMA to LAD LV function was normal he was referred to [place] because of recurrent chest pain happening at lower level of exercises he eventually ended up being transferred to [facility] and cardiac catheterization has been performed cardiac catheterization basically showed the same changes that he was noted to have on his previous cardiac catheterization in 2019.  There were no target lesion for intervention.  He comes today to my office for follow-up He comes today to muscle follow-up overall he said he is doing better but still complain of Some pain that happen in different situations usually at rest he can walk climb stairs doing well but then when he wants to be he will get it.  He did not try nitroglycerin he did not try Maalox or Mylanta he does take omeprazole which gave him relief  Past Medical History:  Diagnosis Date   Alcohol abuse    16 oz cans-up to 18 cans per day   Anemia    Anxiety    Arthritis    Coronary  artery disease    Depression    GERD (gastroesophageal reflux disease)    History of heart attack 2010   Hx of CABG 2012   Hyperlipidemia    Hypertension    Myocardial infarction [facility])     Past Surgical History:  Procedure Laterality Date   APPENDECTOMY     BACK SURGERY  04/07/2019   [place]. [facility]    CORONARY ARTERY BYPASS GRAFT  2012   KNEE ARTHROCENTESIS     right nee   LEFT HEART CATH AND CORS/GRAFTS ANGIOGRAPHY N/A 12/01/2021   Procedure: LEFT HEART CATH AND CORS/GRAFTS ANGIOGRAPHY;  Surgeon: Martinique, Peter M, MD;  Location: [place] [facility];  Service: Cardiovascular;  Laterality: N/A;   TONSILECTOMY, ADENOIDECTOMY, BILATERAL MYRINGOTOMY AND TUBES      Current Medications: Current Meds  Medication Sig   aspirin 81 MG chewable tablet Chew 81 mg by mouth 2 (two) times daily.   atorvastatin (LIPITOR) 80 MG tablet Take 1 tablet (80 mg total) by mouth daily.   gabapentin (NEURONTIN) 600 MG tablet Take 600 mg by mouth every 4 (four) hours.   lisinopril (ZESTRIL) 10 MG tablet Take 10 mg by mouth daily.   Magnesium 250 MG TABS Take 250 mg by mouth in the morning.   meloxicam (MOBIC) 15 MG tablet Take 15 mg by mouth daily.   metoprolol succinate (TOPROL XL) 50 MG 24 hr tablet Take 1 tablet (50 mg total) by mouth daily.  Take with or immediately following a meal.   nitroGLYCERIN (NITROSTAT) 0.4 MG SL tablet Place 1 tablet (0.4 mg total) under the tongue every 5 (five) minutes as needed for chest pain.   omeprazole (PRILOSEC) 40 MG capsule Take 1 capsule (40 mg total) by mouth daily.   ranolazine (RANEXA) 500 MG 12 hr tablet Take 1 tablet (500 mg total) by mouth 2 (two) times daily.   tadalafil (CIALIS) 10 MG tablet Take 10 mg by mouth daily as needed for erectile dysfunction.   tamsulosin (FLOMAX) 0.4 MG CAPS capsule Take 0.4 mg by mouth daily.     Allergies:   Patient has no known allergies.   Social History   Socioeconomic History   Marital status: Married     Spouse name: Anne Ng   Number of children: 1   Years of education: 9   Highest education level: 9th grade  Occupational History   Occupation: factory  Tobacco Use   Smoking status: Former    Packs/day: 1.00    Types: Cigarettes    Start date: 1978    Quit date: 03/08/2019    Years since quitting: 2.8   Smokeless tobacco: Former    Types: Chew    Quit date: 1982  Vaping Use   Vaping Use: Never used  Substance and Sexual Activity   Alcohol use: Not Currently   Drug use: Not Currently   Sexual activity: Yes  Other Topics Concern   Not on file  Social History Narrative   Not on file   Social Determinants of Health   Financial Resource Strain: Not on file  Food Insecurity: Not on file  Transportation Needs: Not on file  Physical Activity: Not on file  Stress: Not on file  Social Connections: Not on file     Family History: The patient's family history is negative for Colon cancer, Esophageal cancer, Rectal cancer, and Stomach cancer. ROS:   Please see the history of present illness.    All 14 point review of systems negative except as described per history of present illness  EKGs/Labs/Other Studies Reviewed:      Recent Labs: 10/14/2021: Creatinine, Ser 0.80  Recent Lipid Panel    Component Value Date/Time   LDLDIRECT 92 12/22/2021 0938    Physical Exam:    VS:  BP 100/60 (BP Location: Left Arm, Patient Position: Sitting)   Pulse 73   Ht '5\' 10"'$  (1.778 m)   Wt 163 lb 3.2 oz (74 kg)   SpO2 98%   BMI 23.42 kg/m     Wt Readings from Last 3 Encounters:  01/24/22 163 lb 3.2 oz (74 kg)  12/22/21 163 lb 12.8 oz (74.3 kg)  12/01/21 160 lb 15 oz (73 kg)     GEN:  Well nourished, well developed in no acute distress HEENT: Normal NECK: No JVD; No carotid bruits LYMPHATICS: No lymphadenopathy CARDIAC: RRR, no murmurs, no rubs, no gallops RESPIRATORY:  Clear to auscultation without rales, wheezing or rhonchi  ABDOMEN: Soft, non-tender,  non-distended MUSCULOSKELETAL:  No edema; No deformity  SKIN: Warm and dry LOWER EXTREMITIES: no swelling NEUROLOGIC:  Alert and oriented x 3 PSYCHIATRIC:  Normal affect   ASSESSMENT:    1. Hx of CABG   2. Essential hypertension   3. CAD in native artery   4. Precordial chest pain   5. Dyslipidemia    PLAN:    In order of problems listed above:  Coronary disease status post recent cardiac catheterization showing no target lesion  for intervention, in spite of that she still gets some symptoms I asked him to try nitroglycerin When he gets pain.  I will also ask him to try Maalox and Mylanta and see if it helps.  I will also suggest to see GI specialist essential hypertension blood pressure seems to well controlled continue present management.  Dyslipidemia.  I do not have his latest fasting lipid profile but he tells me that his primary care physician recently changed him from pravastatin to Lipitor 80 mg he can tolerate this medication quite well.  Continue present management. Smoking obviously huge problem had a long discussion and strongly recommended to quit We had a long discussion about the situation.  Looks like his last cardiac catheterization did not show any target for intervention in spite of that he still continues to have this atypical chest pain.  We will try nitroglycerin we will try Maalox and Mylanta   Medication Adjustments/Labs and Tests Ordered: Current medicines are reviewed at length with the patient today.  Concerns regarding medicines are outlined above.  No orders of the defined types were placed in this encounter.  Medication changes: No orders of the defined types were placed in this encounter.   Signed, Park Liter, MD, [facility] 01/24/2022 2:16 PM    [facility]

## 2022-03-24 ENCOUNTER — Encounter: Payer: Self-pay | Admitting: Gastroenterology

## 2022-04-26 ENCOUNTER — Ambulatory Visit: Payer: Commercial Managed Care - HMO | Attending: Cardiology | Admitting: Cardiology

## 2022-04-28 ENCOUNTER — Encounter: Payer: Self-pay | Admitting: Cardiology

## 2022-07-23 DIAGNOSIS — D213 Benign neoplasm of connective and other soft tissue of thorax: Secondary | ICD-10-CM | POA: Insufficient documentation

## 2022-08-01 ENCOUNTER — Telehealth: Payer: Self-pay

## 2022-08-01 ENCOUNTER — Other Ambulatory Visit (HOSPITAL_COMMUNITY): Payer: Self-pay

## 2022-08-02 ENCOUNTER — Telehealth: Payer: Self-pay

## 2022-08-02 NOTE — Telephone Encounter (Signed)
-----   Message from Maralyn Sago, CPhT sent at 08/01/2022  2:15 PM EDT ----- Regarding: RE: Ranolazine 500mg  Hi Kahari Critzer,          I've ran a test claim for this patients Rx ''Ranolzaine 500mg '' and no P/A is required. The cost is 24.37 for a 51mth supply ----- Message ----- From: Darrel Reach, CMA Sent: 07/31/2022   2:04 PM EDT To: Rx Prior Auth Team Subject: Ranolazine 500mg                                This is the most current insurance information [id number] Rx Bin: [id number] Group: [id number]  I appreciate your help.

## 2022-08-02 NOTE — Telephone Encounter (Signed)
Patient notified

## 2022-08-30 ENCOUNTER — Ambulatory Visit: Payer: No Typology Code available for payment source | Attending: Cardiology | Admitting: Cardiology

## 2022-08-30 ENCOUNTER — Encounter: Payer: Self-pay | Admitting: Cardiology

## 2022-08-30 VITALS — BP 136/84 | HR 84 | Ht 70.0 in | Wt 184.0 lb

## 2022-08-30 DIAGNOSIS — I2510 Atherosclerotic heart disease of native coronary artery without angina pectoris: Secondary | ICD-10-CM

## 2022-08-30 DIAGNOSIS — Z951 Presence of aortocoronary bypass graft: Secondary | ICD-10-CM

## 2022-08-30 DIAGNOSIS — Z720 Tobacco use: Secondary | ICD-10-CM

## 2022-08-30 DIAGNOSIS — E785 Hyperlipidemia, unspecified: Secondary | ICD-10-CM

## 2022-08-30 DIAGNOSIS — I10 Essential (primary) hypertension: Secondary | ICD-10-CM

## 2022-08-30 MED ORDER — BUPROPION HCL ER (SR) 150 MG PO TB12: 150.0000 mg | ORAL_TABLET | Freq: Two times a day (BID) | ORAL | 1 refills | Status: AC

## 2022-08-30 NOTE — Progress Notes (Signed)
Cardiology Office Note:    Date:  08/30/2022   ID:  Stephen Walsh, DOB 03/12/1971, MRN [id number]  PCP:  Stephen Jack, FNP  Cardiologist:  Stephen Balsam, MD    Referring MD: Stephen Walsh, *   Chief Complaint  Patient presents with   Follow-up  Doing fine  History of Present Illness:    Stephen Walsh is a 52 y.o. male past medical history significant for coronary artery disease, status post coronary bypass graft done in 2013, peripheral vascular disease, hyperlipidemia, essential hypertension, tobacco abuse.  Apparently he did have myocardial infarction 2011 after that 2 years later cardiac catheterization was done bypass was needed LIMA to LAD SVG to diagonal and obtuse marginal branch.  He did have cardiac catheterization done again in the summer 2023 which showed single-vessel occlusive disease involving mid LAD patent LIMA to LAD patent stent in distal RCA occluded SVG to obtuse marginal SVG to diagonal, normal ejection fraction and normal LVEDP.  Comes today to months for follow-up.  Overall doing well.  He denies have any chest pain tightness squeezing pressure burning chest, no palpitations dizziness swelling of lower extremities overall doing well  Past Medical History:  Diagnosis Date   Alcohol abuse    16 oz cans-up to 18 cans per day   Anemia    Anxiety    Arthritis    Coronary artery disease    Depression    GERD (gastroesophageal reflux disease)    History of heart attack 2010   Hx of CABG 2012   Hyperlipidemia    Hypertension    Myocardial infarction     Past Surgical History:  Procedure Laterality Date   APPENDECTOMY     BACK SURGERY  04/07/2019   [place]    CORONARY ARTERY BYPASS GRAFT  2012   KNEE ARTHROCENTESIS     right nee   LEFT HEART CATH AND CORS/GRAFTS ANGIOGRAPHY N/A 12/01/2021   Procedure: LEFT HEART CATH AND CORS/GRAFTS ANGIOGRAPHY;  Surgeon: [place]az[place], Peter M, MD;  Location: [facility] INVASIVE [facility] LAB;   Service: Cardiovascular;  Laterality: N/A;   TONSILECTOMY, ADENOIDECTOMY, BILATERAL MYRINGOTOMY AND TUBES      Current Medications: Current Meds  Medication Sig   aspirin 81 MG chewable tablet Chew 81 mg by mouth 2 (two) times daily.   atorvastatin (LIPITOR) 80 MG tablet Take 1 tablet (80 mg total) by mouth daily.   gabapentin (NEURONTIN) 600 MG tablet Take 600 mg by mouth every 4 (four) hours.   lisinopril (ZESTRIL) 10 MG tablet Take 10 mg by mouth daily.   Magnesium 250 MG TABS Take 250 mg by mouth in the morning.   meloxicam (MOBIC) 15 MG tablet Take 15 mg by mouth daily.   metoprolol succinate (TOPROL XL) 50 MG 24 hr tablet Take 1 tablet (50 mg total) by mouth daily. Take with or immediately following a meal.   nitroGLYCERIN (NITROSTAT) 0.4 MG SL tablet Place 1 tablet (0.4 mg total) under the tongue every 5 (five) minutes as needed for chest pain.   omeprazole (PRILOSEC) 40 MG capsule Take 1 capsule (40 mg total) by mouth daily.   ranolazine (RANEXA) 500 MG 12 hr tablet Take 1 tablet (500 mg total) by mouth 2 (two) times daily.   tadalafil (CIALIS) 10 MG tablet Take 10 mg by mouth daily as needed for erectile dysfunction.   tamsulosin (FLOMAX) 0.4 MG CAPS capsule Take 0.4 mg by mouth daily.     Allergies:   Patient  has no known allergies.   Social History   Socioeconomic History   Marital status: Married    Spouse name: Stephen Walsh   Number of children: 1   Years of education: 9   Highest education level: 9th grade  Occupational History   Occupation: factory  Tobacco Use   Smoking status: Former    Packs/day: 1    Types: Cigarettes    Start date: 1978    Quit date: 03/08/2019    Years since quitting: 3.4   Smokeless tobacco: Former    Types: Chew    Quit date: 1982  Vaping Use   Vaping Use: Never used  Substance and Sexual Activity   Alcohol use: Not Currently   Drug use: Not Currently   Sexual activity: Yes  Other Topics Concern   Not on file  Social History  Narrative   Not on file   Social Determinants of Health   Financial Resource Strain: Not on file  Food Insecurity: Not on file  Transportation Needs: Not on file  Physical Activity: Not on file  Stress: Not on file  Social Connections: Not on file     Family History: The patient's family history is negative for Colon cancer, Esophageal cancer, Rectal cancer, and Stomach cancer. ROS:   Please see the history of present illness.    All 14 point review of systems negative except as described per history of present illness  EKGs/Labs/Other Studies Reviewed:      Recent Labs: 10/14/2021: Creatinine, Ser 0.80  Recent Lipid Panel    Component Value Date/Time   LDLDIRECT 92 12/22/2021 0938    Physical Exam:    VS:  BP 136/84 (BP Location: Left Arm, Patient Position: Sitting)   Pulse 84   Ht  (1.778 m)   Wt 184 lb (83.5 kg)   SpO2 99%   BMI 26.40 kg/m     Wt Readings from Last 3 Encounters:  08/30/22 184 lb (83.5 kg)  01/24/22 163 lb 3.2 oz (74 kg)  12/22/21 163 lb 12.8 oz (74.3 kg)     GEN:  Well nourished, well developed in no acute distress HEENT: Normal NECK: No JVD; No carotid bruits LYMPHATICS: No lymphadenopathy CARDIAC: RRR, no murmurs, no rubs, no gallops RESPIRATORY:  Clear to auscultation without rales, wheezing or rhonchi  ABDOMEN: Soft, non-tender, non-distended MUSCULOSKELETAL:  No edema; No deformity  SKIN: Warm and dry LOWER EXTREMITIES: no swelling NEUROLOGIC:  Alert and oriented x 3 PSYCHIATRIC:  Normal affect   ASSESSMENT:    1. CAD in native artery   2. Essential hypertension   3. Hx of CABG   4. Dyslipidemia   5. Tobacco abuse    PLAN:    In order of problems listed above:  Coronary disease stable from that point reviewed appropriate guideline directed medical therapy decrease risk factors modifications. Essential hypertension blood pressure seems to well-controlled continue present management. Dyslipidemia I do not have his  fasting lipid profile Will call primary care physician to get a copy of his fasting lipid profile. 4.  Smoking we had a long discussion at least 10 minutes talk about potential way to quit smoking.  He want me to give him Wellbutrin and we will give him prescription for Wellbutrin ER 150 mg twice daily  Medication Adjustments/Labs and Tests Ordered: Current medicines are reviewed at length with the patient today.  Concerns regarding medicines are outlined above.  No orders of the defined types were placed in this encounter.  Medication  changes: No orders of the defined types were placed in this encounter.   Signed, Georgeanna Lea, MD, [facility] 08/30/2022 3:14 PM    [facility]

## 2022-08-30 NOTE — Patient Instructions (Addendum)
Medication Instructions:   START: Wellbutrin  1 tablet daily for 3 days then  twice daily x 6 weeks.   Lab Work: None Ordered If you have labs (blood work) drawn today and your tests are completely normal, you will receive your results only by: MyChart Message (if you have MyChart) OR A paper copy in the mail If you have any lab test that is abnormal or we need to change your treatment, we will call you to review the results.   Testing/Procedures: None Ordered   Follow-Up: At [facility], you and your health needs are our priority.  As part of our continuing mission to provide you with exceptional heart care, we have created designated Provider Care Teams.  These Care Teams include your primary Cardiologist (physician) and Advanced Practice Providers (APPs -  Physician Assistants and Nurse Practitioners) who all work together to provide you with the care you need, when you need it.  We recommend signing up for the patient portal called "MyChart".  Sign up information is provided on this After Visit Summary.  MyChart is used to connect with patients for Virtual Visits (Telemedicine).  Patients are able to view lab/test results, encounter notes, upcoming appointments, etc.  Non-urgent messages can be sent to your provider as well.   To learn more about what you can do with MyChart, go to [email or web address].    Your next appointment:   6 month(s)  The format for your next appointment:   In Person  Provider:   Gypsy Balsam, MD    Other Instructions NA

## 2022-08-30 NOTE — Addendum Note (Signed)
Addended by: Baldo Ash D on: 08/30/2022 03:29 PM   Modules accepted: Orders

## 2022-09-06 ENCOUNTER — Ambulatory Visit: Payer: Commercial Managed Care - HMO | Admitting: Cardiology

## 2022-11-22 ENCOUNTER — Other Ambulatory Visit: Payer: Self-pay | Admitting: Cardiology

## 2022-12-26 ENCOUNTER — Other Ambulatory Visit: Payer: Self-pay | Admitting: Cardiology

## 2023-01-10 ENCOUNTER — Other Ambulatory Visit: Payer: Self-pay | Admitting: Cardiology

## 2023-05-02 ENCOUNTER — Other Ambulatory Visit: Payer: Self-pay | Admitting: Cardiology

## 2023-05-03 NOTE — Telephone Encounter (Signed)
Prescription sent to pharmacy.

## 2023-05-29 IMAGING — CT CT CHEST W/ CM
2 of 4 series · 15 of 36 positions shown, 18 images · IV contrast (agent unspecified)
Comparison: Chest radiograph dated March 11, 2019

CLINICAL DATA: 80 pounds weight loss

EXAM:
CT CHEST WITH CONTRAST
TECHNIQUE: Multidetector CT imaging of the chest was performed during
intravenous contrast administration.

[Series 2: axial st · axial · 0.79mm/px · z∈[+940,+1248]mm · 12 of 183 slices shown, 15 images]
[im 15/183  mediastinal]
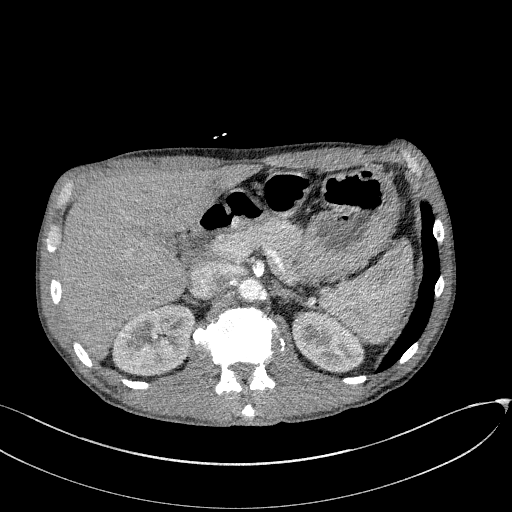
[im 15/183  lung]
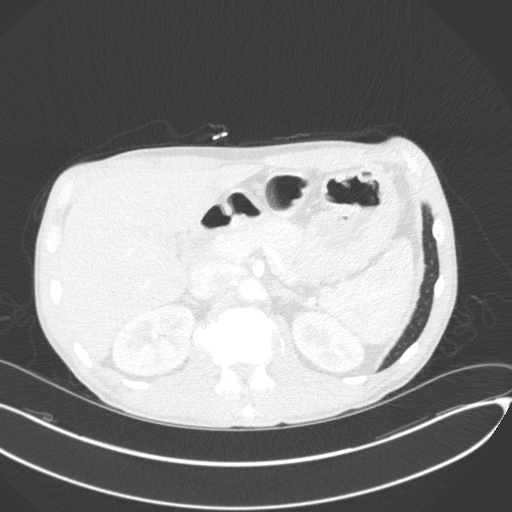
[im 29/183  lung]
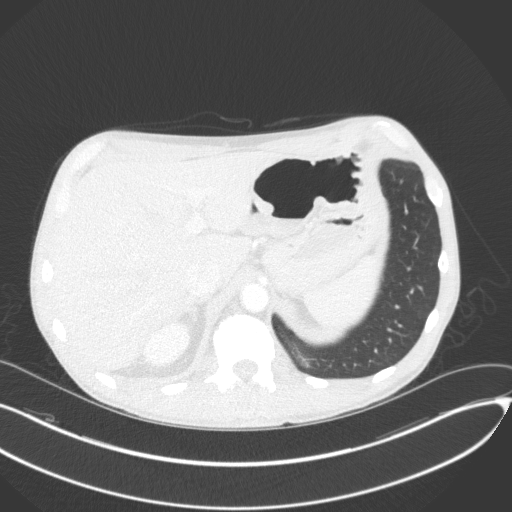
[im 43/183  lung]
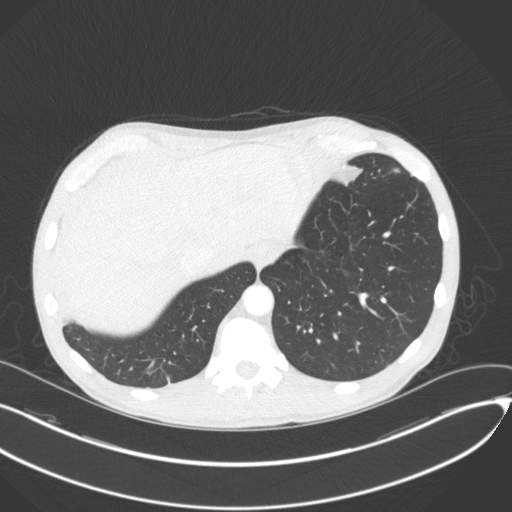
[im 57/183  lung]
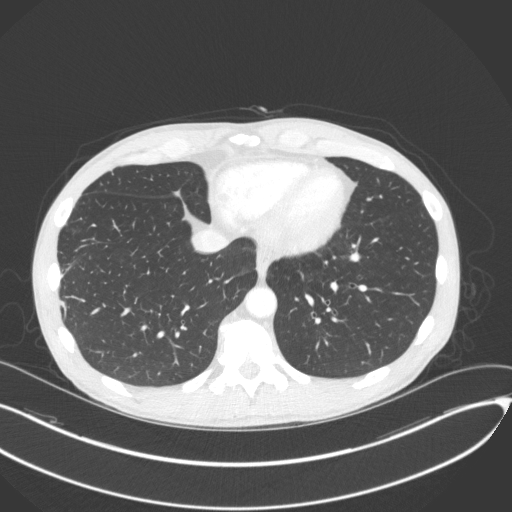
[im 71/183  mediastinal]
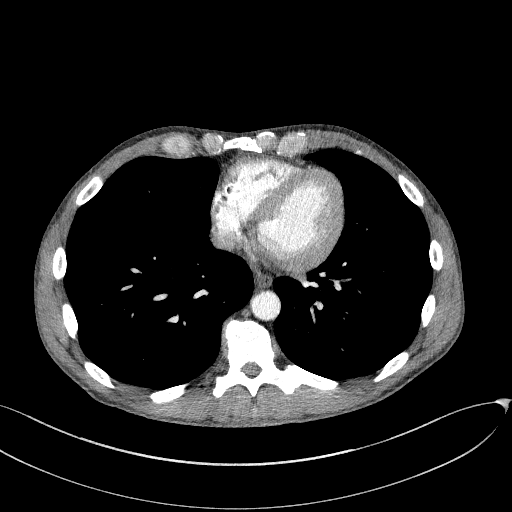
[im 71/183  lung]
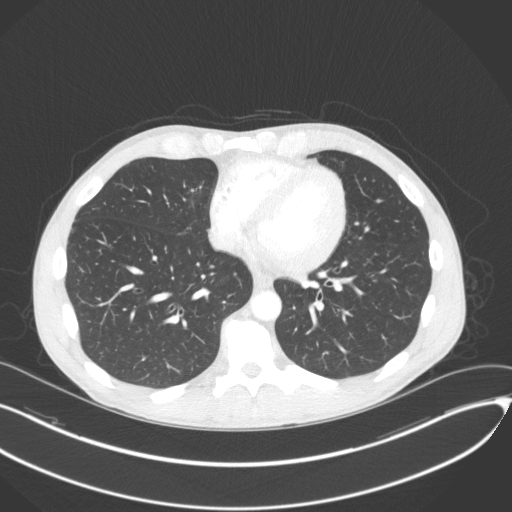
[im 85/183  lung]
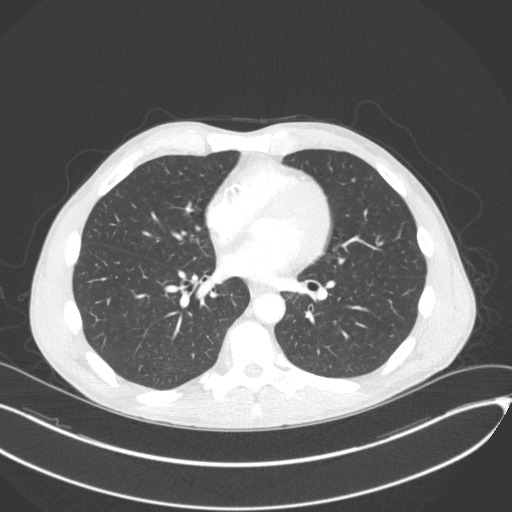
[im 99/183  lung]
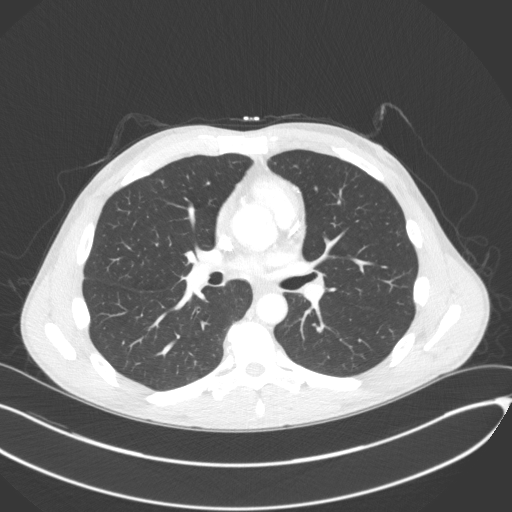
[im 113/183  lung]
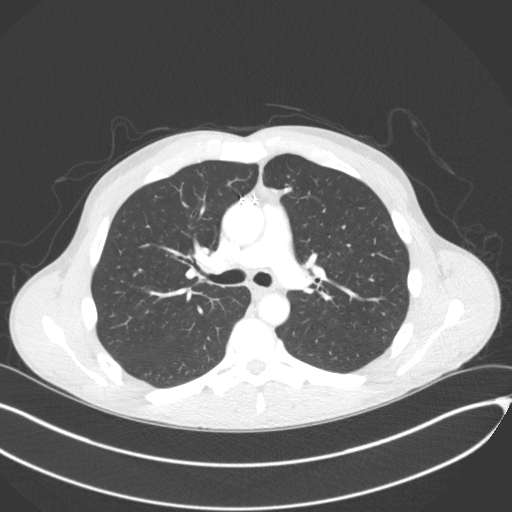
[im 127/183  mediastinal]
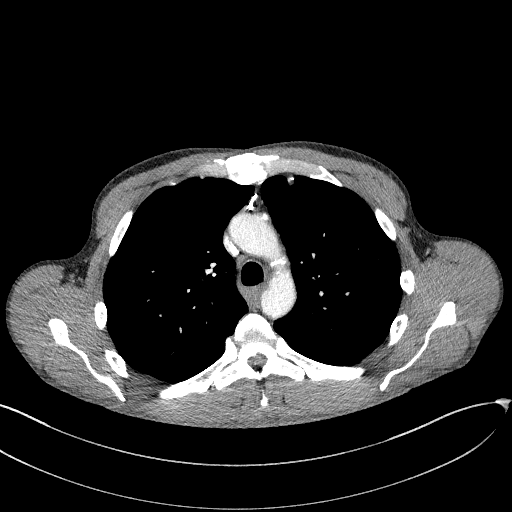
[im 127/183  lung]
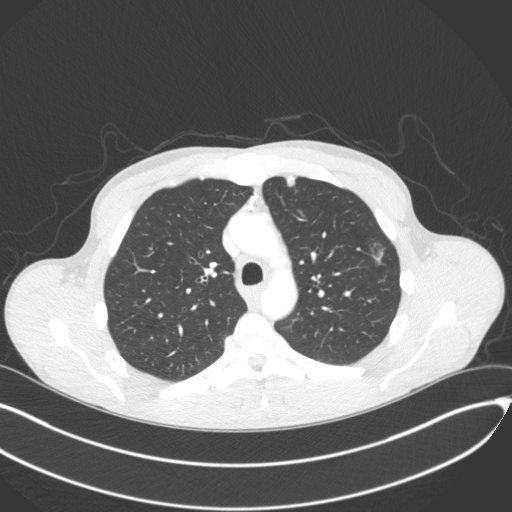
[im 141/183  lung]
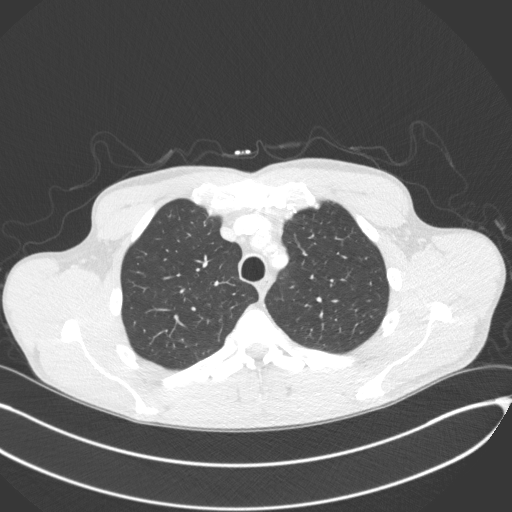
[im 155/183  lung]
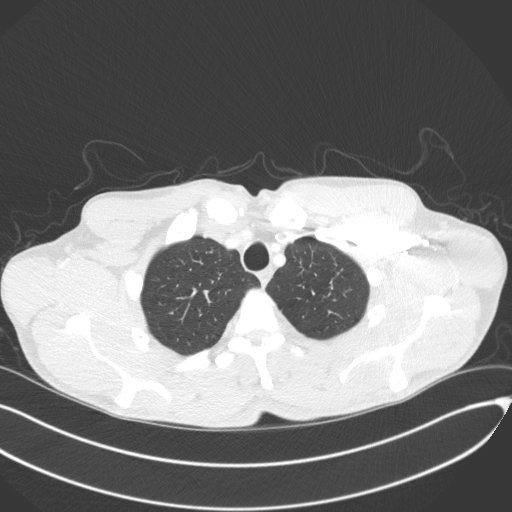
[im 169/183  lung]
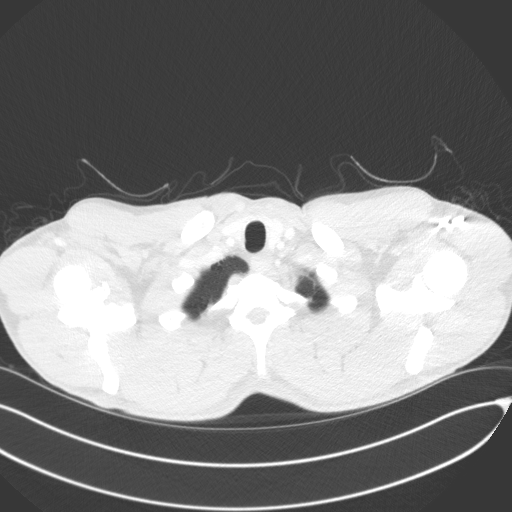

[Series 5: coronal · coronal · 0.72mm/px · 3 of 119 slices shown]
[im 24/119  lung]
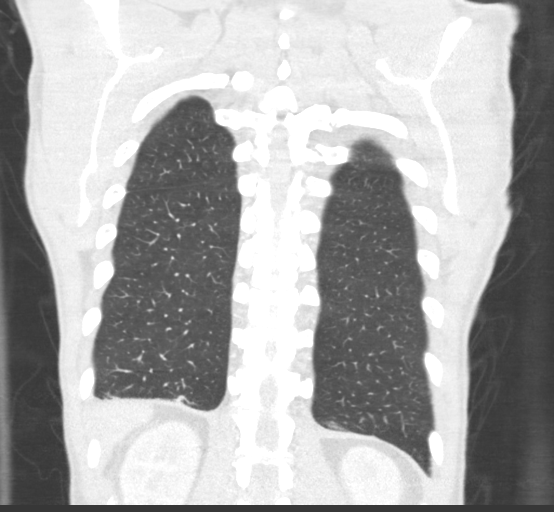
[im 48/119  lung]
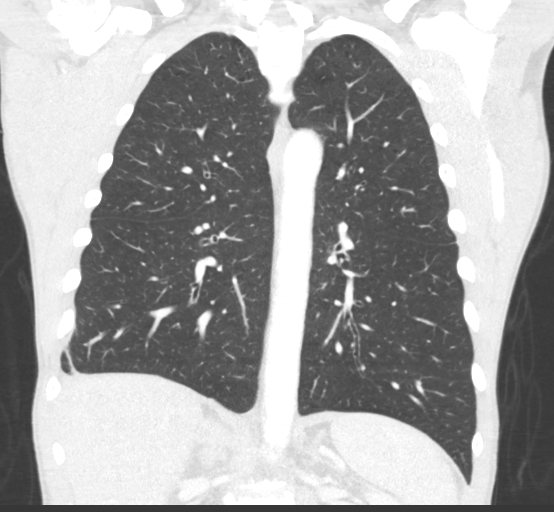
[im 71/119  lung]
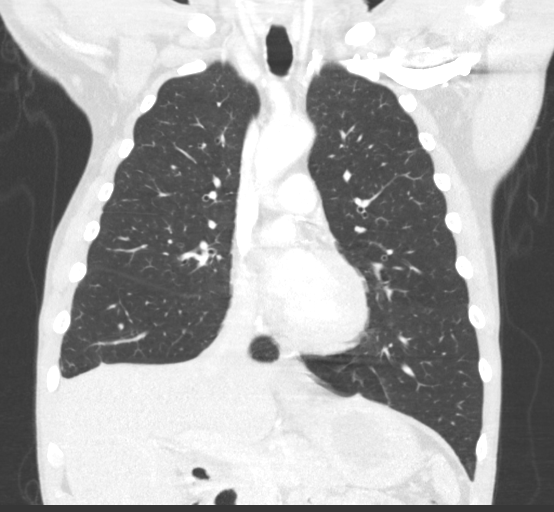

[15 of 36 positions shown; findings below may reference images not displayed]

RADIATION DOSE REDUCTION: This exam was performed according to the
departmental dose-optimization program which includes automated
exposure control, adjustment of the mA and/or kV according to
patient size and/or use of iterative reconstruction technique.

CONTRAST:  75mL OMNIPAQUE IOHEXOL 300 MG/ML  SOLN
FINDINGS: Cardiovascular: Heart is normal in size. No pericardial effusion.
Status post median sternotomy and CABG. Atherosclerotic
calcifications are scattered throughout the nonaneurysmal thoracic
aorta. Three-vessel coronary artery atherosclerotic calcifications.

Mediastinum/Nodes: Visualized thyroid is unremarkable. No axillary
adenopathy. Mildly prominent lymph node in the AP window measures 9
mm in the short axis (series 2, image 61). No mediastinal adenopathy
by size criteria.

Lungs/Pleura: Mild bronchial wall thickening. No pleural effusion or
pneumothorax. Mild centrilobular emphysema. RIGHT apical nodule
versus scarring measures 7 by 6 mm (series 3, image 38). Scarring
along the LEFT paramediastinal border. RIGHT lower lobe possible
nodule measures 4 mm (series 3, image 96).

Upper Abdomen: Hepatic steatosis.  Duodenal diverticulum.

Musculoskeletal: Status post median sternotomy. No acute osseous
abnormality
IMPRESSION: 1. RIGHT apical 7 mm pulmonary nodule versus scarring. Recommend a
non-contrast Chest CT at 6-12 months, then another non-contrast
Chest CT at 18-24 months.
These guidelines do not apply to immunocompromised patients and
patients with cancer. Follow up in patients with significant
comorbidities as clinically warranted. For lung cancer screening,
adhere to Lung-RADS guidelines. Reference: Radiology. 4475;
284(1):228-43.
2. Given underlying emphysema, recommend evaluation for candidacy
for annual lung cancer screening CT.
3. Hepatic steatosis.

Aortic Atherosclerosis (G7CLI-HQZ.Z) and Emphysema (G7CLI-P7E.G).

## 2023-07-31 NOTE — H&P (Signed)
  Patient: Stephen Walsh  PID: [id number]  DOB: 03/11/1971  SEX: Male   Patient referred by DDS for frenectomy and alveoloplasty  CC: Wants to get dentures. Needs bone surgery first.  Past Medical History:  High Blood Pressure, Heart Attack, Heart Surgery, Pneumonia, Snoring, Sleep Apnea, Arthritis, Alcoholism, in remission, Acid Reflux, High Cholesterol    Medications: Pravastatin, Aspirin, Flomax, Lisinopril, Omeprazole, Gabapentin, HCTZ, Isorbide, Protonix, Cialis, Mobic   Allergies:     NKDA    Surgeries:   Heart Surgery, knee surgery, Appendectomy, Colonoscopy     Social History       Smoking:            Alcohol: Drug use:                             Exam: BMI 27. Edentulous maxilla and mandible. Irregular contour maxilla and mandible alveolar ridges. Large bilateral mandibular lingual tori. Lingual frenum attaches to superior border of alveolar ridge.  No purulence, edema, fluctuance, trismus. Oral cancer screening negative. Pharynx clear. No lymphadenopathy.  Panorex: Edentulous maxilla and mandible.   Assessment: ASA 2. Irregular  contour maxilla and mandible alveolar ridges. Large bilateral mandibular lingual tori. Lingual frenum attaches to superior border of alveolar ridge and will interfere with denture seating and retention.          Plan: 1. Cardiac clearance - done.   2. Alveoloplasty maxilla and mandible, remove bilateral mandibular lingual tori. lingual frenectomy. Hospital Day surgery.                 Rx: n               Risks and complications explained. Questions answered.   Georgia Lopes, DMD

## 2023-08-01 ENCOUNTER — Other Ambulatory Visit: Payer: Self-pay

## 2023-08-01 ENCOUNTER — Encounter (HOSPITAL_COMMUNITY): Payer: Self-pay | Admitting: Oral Surgery

## 2023-08-01 NOTE — Progress Notes (Signed)
 PCP - Levonne Spiller, NP Cardiologist - Dr. Vernona Rieger  PPM/ICD - denies   Chest x-ray - 05/10/23 EKG - 12/22/21 (DOS) Stress Test - 11/22/22 ECHO - 06/01/23 Cardiac Cath - 05/24/23  CPAP - denies  DM- denies  Blood Thinner Instructions: n/a Aspirin Instructions: f/u with surgeon. Last dose 3/19  ERAS Protcol - no, NPO  COVID TEST- n/a  Anesthesia review: yes, cardiac hx  Patient verbally denies any shortness of breath, fever, cough and chest pain during phone call      Questions were answered. Patient verbalized understanding of instructions.

## 2023-08-01 NOTE — Pre-Procedure Instructions (Signed)
-------------    SDW INSTRUCTIONS given:  Your procedure is scheduled on 3/21.  Report to [facility] Main Entrance "A" at 05:30 A.M., and check in at the Admitting office.  Any questions or running late day of surgery: call [phone]    Remember:  Do not eat or drink after midnight the night before your surgery     Take these medicines the morning of surgery with A SIP OF WATER  Gabapentin Prilosec Pravachol Flomax    May take these medicines IF NEEDED: HYDROcodone-acetaminophen (NORCO/VICODIN)  methocarbamol (ROBAXIN)  nitroGLYCERIN (NITROSTAT)- If you have to take this medication prior to surgery, please call [phone] and report this to a nurse  traMADol Janean Sark)    Follow your surgeon's instructions on when to stop Aspirin.  If no instructions were given by your surgeon then you will need to call the office to get those instructions.    As of today, STOP taking any Aleve, Naproxen, Ibuprofen, Motrin, Advil, Goody's, BC's, all herbal medications, fish oil, and all vitamins. This includes meloxicam (Mobic).   Do NOT Smoke (Tobacco/Vaping) 24 hours prior to your procedure  If you use a CPAP at night, you may bring all equipment for your overnight stay.     You will be asked to remove any contacts, glasses, piercing's, hearing aid's, dentures/partials prior to surgery. Please bring cases for these items if needed.     Patients discharged the day of surgery will not be allowed to drive home, and someone needs to stay with them for 24 hours.  SURGICAL WAITING ROOM VISITATION Patients may have no more than 2 support people in the waiting area - these visitors may rotate.   Pre-op nurse will coordinate an appropriate time for 1 ADULT support person, who may not rotate, to accompany patient in pre-op.  Children under the age of 29 must have an adult with them who is not the patient and must remain in the main waiting area with an adult.  If the patient needs to stay at the  hospital during part of their recovery, the visitor guidelines for inpatient rooms apply.  Please refer to the [facility] website for the visitor guidelines for any additional information.   Special instructions:   [facility]- Preparing For Surgery   Please follow these instructions carefully.   Shower the NIGHT BEFORE SURGERY and the MORNING OF SURGERY with DIAL Soap.   Pat yourself dry with a CLEAN TOWEL.  Wear CLEAN PAJAMAS to bed the night before surgery  Place CLEAN SHEETS on your bed the night of your first shower and DO NOT SLEEP WITH PETS.   Additional instructions for the day of surgery: DO NOT APPLY any lotions, deodorants, cologne, or perfumes.   Do not wear jewelry or makeup Do not wear nail polish, gel polish, artificial nails, or any other type of covering on natural nails (fingers and toes) Do not bring valuables to the hospital. [facility] is not responsible for valuables/personal belongings. Put on clean/comfortable clothes.  Please brush your teeth.  Ask your nurse before applying any prescription medications to the skin.

## 2023-08-01 NOTE — Progress Notes (Signed)
 Anesthesia Chart Review: Same day workup  53 year old male follows with cardiology at [facility] for history of HTN, HLD, CAD s/p inferior MI 2011 treated with stent to RCA and subsequent CABG x 3 in 2019.  He has chronic left-sided chest pain.  Cath in 2023 showed patent LIMA to LAD, patent stent to distal RCA, occluded SVG to OM, occluded SVG to diagonal, native CAD with ostial left main 30%, mid LAD 100%, OM1 40%, OM2 30%, RCA 35%, normal LV function and LVEDP.  He recently represented with left-sided chest pain and underwent cath 05/24/2023 at [facility].  Per cath report, "No disease that would explain his symptoms, characterized by persistent non-anginal chest pain. He had pain during the procedure that was not responsive to nitroglycerin, and persistent despite confirmation of  continued vessel patency, including non-selective left main injection. All territories appear to have normal flow. The unread CT scan appears to have a new left anterior pulmonary nodule that involves the pleura and abuts the pericardium as well. Unclear if this could be the cause of his chest pain, but this is directly at the point where he describes the pain. Discussed with referring NP, Maurie Boettcher, who has placed referral for pulmonology consult."  Subsequent echo 06/01/2023 showed LVEF 60 to 65%, normal diastolic function, normal valves.  Patient was cleared by cardiology as low risk in letter dated 07/20/2023.  Copy scanned in media.  Patient was seen by pulmonologist Dr. Craige Cotta for initial evaluation on 07/25/2023.  He was noted to have a 46-pack-year history of smoking, quit in 2024.  Spirometry at that time showed moderate to severe obstruction.  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  Other pertinent history includes GERD, prior EtOH abuse.  Patient will need day of surgery labs and evaluation.  CT  chest 05/18/2023 (Care Everywhere): 1. No acute abnormality.  2. Indistinct 0.8 x 0.6 cm posterior apical right upper lobe  pulmonary nodule, stable since 10/14/2021 chest CT study, compatible  with benign etiology.  3.  Aortic Atherosclerosis (ICD10-I70.0).    TTE 06/01/2023: SUMMARY  Normal LV size, wall thickness, wall motion and systolic function with ejection fraction 60-65%  There is no significant valvular stenosis or regurgitation.  There is no comparison study available.   Cath 05/24/2023: DIAGNOSTIC FINDINGS:    Coronary anatomy is right dominant  LM: 40% ostial  LAD: severe moderate-severe disease of the mid-vessel with approximately  50-60% stenosis from S1 to D1, then 80% just after the D1 bifurcation and  at least moderate disease from D1 to the LIMA anastamosis but difficult to  quantify due to competitive filling. D1 is a 2mm vessel with  non-obstructive disease.  LCX: OM1 40%; otherwise mild disease in the main vessel and OM2.  RCA: 50% prox (calcified), otherwise mild disease in the main vessel; the  RPAV is stented through the ostium with 40-50% ostial ISR; 30% ostial RPDA  LIMA-LAD: widely patent with patent distal LAD  SVGs to OM1 and D1: both occluded  Left subclavian artery is widely patent   COMPLICATIONS:  None   EBL: <25 mL   RECOMMENDATIONS:  No disease that would explain his symptoms, characterized by persistent  non-anginal chest pain. He had pain during the procedure that was not  responsive to nitroglycerin, and persistent despite confirmation of  continued vessel patency, including non-selective left main injection. All  territories appear to have normal flow.  The unread CT scan appears to have a new left anterior pulmonary nodule  that involves the pleura and abuts the pericardium as well. Unclear if  this could be the cause of his chest pain, but this is directly at the  point where he describes the pain. Discussed with referring NP, Maurie Boettcher, who has placed referral for pulmonology consult.      [facility] Short Stay Center/Anesthesiology Phone [phone] 08/01/2023 9:07 AM

## 2023-08-01 NOTE — Anesthesia Preprocedure Evaluation (Signed)
 Anesthesia Evaluation  Patient identified by MRN, date of birth, ID band Patient awake    Reviewed: Allergy & Precautions, NPO status , Patient's Chart, lab work & pertinent test results  Airway Mallampati: I  TM Distance: >3 FB Neck ROM: Full    Dental no notable dental hx. (+) Edentulous Upper, Edentulous Lower   Pulmonary sleep apnea , COPD,  COPD inhaler, former smoker   Pulmonary exam normal breath sounds clear to auscultation       Cardiovascular hypertension, Pt. on medications + CAD, + Past MI (2011) and + CABG (2019)  Normal cardiovascular exam Rhythm:Regular Rate:Normal     Neuro/Psych  PSYCHIATRIC DISORDERS Anxiety Depression       GI/Hepatic ,GERD  ,,  Endo/Other    Renal/GU      Musculoskeletal  (+) Arthritis ,    Abdominal   Peds  Hematology   Anesthesia Other Findings   Reproductive/Obstetrics                             Anesthesia Physical Anesthesia Plan  ASA: 3  Anesthesia Plan: General   Post-op Pain Management:    Induction: Intravenous  PONV Risk Score and Plan: 3 and Treatment may vary due to age or medical condition, Midazolam and Ondansetron  Airway Management Planned: Nasal ETT and Oral ETT  Additional Equipment: None  Intra-op Plan:   Post-operative Plan: Extubation in OR  Informed Consent: I have reviewed the patients History and Physical, chart, labs and discussed the procedure including the risks, benefits and alternatives for the proposed anesthesia with the patient or authorized representative who has indicated his/her understanding and acceptance.     Dental advisory given  Plan Discussed with: CRNA and Surgeon  Anesthesia Plan Comments: (PAT note by Antionette Poles, PA-C: 53 year old male follows with cardiology at [facility] for history of HTN, HLD, CAD s/p inferior MI 2011 treated with stent to RCA and subsequent CABG x 3 in 2019.  He has  chronic left-sided chest pain.  Cath in 2023 showed patent LIMA to LAD, patent stent to distal RCA, occluded SVG to OM, occluded SVG to diagonal, native CAD with ostial left main 30%, mid LAD 100%, OM1 40%, OM2 30%, RCA 35%, normal LV function and LVEDP.  He recently represented with left-sided chest pain and underwent cath 05/24/2023 at [facility].  Per cath report, "No disease thatwould explain his symptoms, characterized by persistent non-anginal chest pain. He had pain during the procedure that was not responsive to nitroglycerin, and persistent despite confirmation of  continued vessel patency, including non-selective left main injection. All territories appear to have normal flow. The unread CT scan appears to have a new left anterior pulmonary nodule that involves the pleura and abuts the pericardium as well. Unclear if this could be the cause of his chest pain, but this is directly at the point where he describes the pain. Discussed with referring NP, Maurie Boettcher, who has placed referral for pulmonology consult."  Subsequent echo 06/01/2023 showed LVEF 60 to 65%, normal diastolic function, normal valves.  Patient was cleared by cardiology as low risk in letter dated 07/20/2023.  Copy scanned in media.  Patient was seen by pulmonologist Dr. Craige Cotta for initial evaluation on 07/25/2023.  He was noted to have a 46-pack-year history of smoking, quit in 2024.  Spirometry at that time showed moderate to severe obstruction.  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  Other pertinent history includes GERD, prior EtOH abuse.  Patient will need day of surgery labs and evaluation.  CT chest 05/18/2023 (Care Everywhere): 1. No acute abnormality.  2. Indistinct 0.8 x 0.6 cm posterior apical right upper lobe  pulmonary nodule, stable since 10/14/2021 chest CT study, compatible  with benign  etiology.  3. Aortic Atherosclerosis (ICD10-I70.0).    TTE 06/01/2023: SUMMARY  Normal LV size, wall thickness, wall motion and systolic function with ejection fraction 60-65%  There is no significant valvular stenosis or regurgitation.  There is no comparison study available.   Cath 05/24/2023: DIAGNOSTIC FINDINGS:   Coronary anatomy is rightdominant  LM: 40% ostial  LAD: severe moderate-severe disease of the mid-vessel with approximately  50-60% stenosis from S1 to D1, then 80% just after the D1 bifurcation and  at least moderate disease from D1 to the LIMA anastamosis but difficult to  quantify due to competitive filling. D1 is a 2mm vessel with  non-obstructive disease.  LCX: OM1 40%; otherwise mild disease in the main vessel and OM2.  RCA: 50% prox (calcified), otherwise mild disease in the main vessel; the  RPAV is stented through the ostium with 40-50% ostial ISR; 30% ostial RPDA  LIMA-LAD: widely patent with patent distal LAD  SVGs to OM1 and D1: both occluded  Left subclavian artery is widely patent   COMPLICATIONS: None   EBL: <25 mL   RECOMMENDATIONS:  No disease thatwould explain his symptoms, characterized by persistent  non-anginal chest pain. He had pain during the procedure that was not  responsive to nitroglycerin, and persistent despite confirmation of  continued vessel patency, including non-selective left main injection. All  territories appear to have normal flow.  The unread CT scan appears to have a new left anterior pulmonary nodule  that involves the pleura and abuts the pericardium as well. Unclear if  this could be the cause of his chest pain, but this is directly at the  point where he describes the pain. Discussed with referring NP, Maurie Boettcher, who has placed referral for pulmonology consult.   )        Anesthesia Quick Evaluation

## 2023-08-01 NOTE — Progress Notes (Signed)
   08/01/23 1327  OBSTRUCTIVE SLEEP APNEA  Have you ever been diagnosed with sleep apnea through a sleep study? No  Do you snore loudly (loud enough to be heard through closed doors)?  1  Do you often feel tired, fatigued, or sleepy during the daytime (such as falling asleep during driving or talking to someone)? 0  Has anyone observed you stop breathing during your sleep? 1  Do you have, or are you being treated for high blood pressure? 1  BMI more than 35 kg/m2? 0  Age > 50 (1-yes) 1  Male Gender (Yes=1) 1  Obstructive Sleep Apnea Score 5

## 2023-08-03 ENCOUNTER — Other Ambulatory Visit: Payer: Self-pay

## 2023-08-03 ENCOUNTER — Ambulatory Visit (HOSPITAL_COMMUNITY)
Admission: RE | Admit: 2023-08-03 | Discharge: 2023-08-03 | Disposition: A | Discharge status: Home / Self Care | Attending: Oral Surgery | Admitting: Oral Surgery

## 2023-08-03 ENCOUNTER — Ambulatory Visit (HOSPITAL_BASED_OUTPATIENT_CLINIC_OR_DEPARTMENT_OTHER): Payer: Self-pay | Admitting: Physician Assistant

## 2023-08-03 ENCOUNTER — Encounter (HOSPITAL_COMMUNITY): Payer: Self-pay | Admitting: Oral Surgery

## 2023-08-03 ENCOUNTER — Encounter (HOSPITAL_COMMUNITY)
Admission: RE | Disposition: A | Discharge status: Home / Self Care | Payer: Self-pay | Source: Home / Self Care | Attending: Oral Surgery

## 2023-08-03 ENCOUNTER — Ambulatory Visit (HOSPITAL_COMMUNITY): Payer: Self-pay | Admitting: Physician Assistant

## 2023-08-03 DIAGNOSIS — K219 Gastro-esophageal reflux disease without esophagitis: Secondary | ICD-10-CM | POA: Insufficient documentation

## 2023-08-03 DIAGNOSIS — I252 Old myocardial infarction: Secondary | ICD-10-CM | POA: Insufficient documentation

## 2023-08-03 DIAGNOSIS — I10 Essential (primary) hypertension: Secondary | ICD-10-CM | POA: Insufficient documentation

## 2023-08-03 DIAGNOSIS — I2510 Atherosclerotic heart disease of native coronary artery without angina pectoris: Secondary | ICD-10-CM | POA: Diagnosis not present

## 2023-08-03 DIAGNOSIS — E7800 Pure hypercholesterolemia, unspecified: Secondary | ICD-10-CM | POA: Insufficient documentation

## 2023-08-03 DIAGNOSIS — Z951 Presence of aortocoronary bypass graft: Secondary | ICD-10-CM | POA: Insufficient documentation

## 2023-08-03 DIAGNOSIS — M278 Other specified diseases of jaws: Secondary | ICD-10-CM | POA: Diagnosis present

## 2023-08-03 DIAGNOSIS — Y832 Surgical operation with anastomosis, bypass or graft as the cause of abnormal reaction of the patient, or of later complication, without mention of misadventure at the time of the procedure: Secondary | ICD-10-CM | POA: Insufficient documentation

## 2023-08-03 DIAGNOSIS — Z79899 Other long term (current) drug therapy: Secondary | ICD-10-CM | POA: Diagnosis not present

## 2023-08-03 DIAGNOSIS — G4730 Sleep apnea, unspecified: Secondary | ICD-10-CM | POA: Insufficient documentation

## 2023-08-03 DIAGNOSIS — F1011 Alcohol abuse, in remission: Secondary | ICD-10-CM | POA: Insufficient documentation

## 2023-08-03 DIAGNOSIS — Z87891 Personal history of nicotine dependence: Secondary | ICD-10-CM | POA: Insufficient documentation

## 2023-08-03 DIAGNOSIS — Z955 Presence of coronary angioplasty implant and graft: Secondary | ICD-10-CM | POA: Insufficient documentation

## 2023-08-03 DIAGNOSIS — J439 Emphysema, unspecified: Secondary | ICD-10-CM | POA: Insufficient documentation

## 2023-08-03 DIAGNOSIS — J449 Chronic obstructive pulmonary disease, unspecified: Secondary | ICD-10-CM

## 2023-08-03 DIAGNOSIS — Q381 Ankyloglossia: Secondary | ICD-10-CM | POA: Diagnosis not present

## 2023-08-03 DIAGNOSIS — K029 Dental caries, unspecified: Secondary | ICD-10-CM

## 2023-08-03 DIAGNOSIS — T82218A Other mechanical complication of coronary artery bypass graft, initial encounter: Secondary | ICD-10-CM | POA: Insufficient documentation

## 2023-08-03 HISTORY — DX: Chronic obstructive pulmonary disease, unspecified: J44.9

## 2023-08-03 HISTORY — PX: TOOTH EXTRACTION: SHX859

## 2023-08-03 LAB — COMPREHENSIVE METABOLIC PANEL
ALT: 30 U/L (ref 0–44)
AST: 29 U/L (ref 15–41)
Albumin: 3.8 g/dL (ref 3.5–5.0)
Alkaline Phosphatase: 52 U/L (ref 38–126)
Anion gap: 10 (ref 5–15)
BUN: 12 mg/dL (ref 6–20)
CO2: 24 mmol/L (ref 22–32)
Calcium: 9.3 mg/dL (ref 8.9–10.3)
Chloride: 101 mmol/L (ref 98–111)
Creatinine, Ser: 1.05 mg/dL (ref 0.61–1.24)
GFR, Estimated: >60 mL/min (ref >60)
Glucose, Bld: 113 mg/dL — ABNORMAL HIGH (ref 70–99)
Potassium: 3.5 mmol/L (ref 3.5–5.1)
Sodium: 135 mmol/L (ref 135–145)
Total Bilirubin: 0.7 mg/dL (ref 0.0–1.2)
Total Protein: 6.7 g/dL (ref 6.5–8.1)

## 2023-08-03 LAB — CBC
HCT: 41 % (ref 39.0–52.0)
Hemoglobin: 13.4 g/dL (ref 13.0–17.0)
MCH: 28.5 pg (ref 26.0–34.0)
MCHC: 32.7 g/dL (ref 30.0–36.0)
MCV: 87.2 fL (ref 80.0–100.0)
Platelets: 248 10*3/uL (ref 150–400)
RBC: 4.7 MIL/uL (ref 4.22–5.81)
RDW: 13.2 % (ref 11.5–15.5)
WBC: 5.8 10*3/uL (ref 4.0–10.5)
nRBC: 0 % (ref 0.0–0.2)

## 2023-08-03 SURGERY — DENTAL RESTORATION/EXTRACTIONS
Anesthesia: General

## 2023-08-03 MED ORDER — MIDAZOLAM HCL 2 MG/2ML IJ SOLN
INTRAMUSCULAR | Status: AC
  Filled 2023-08-03: qty 2

## 2023-08-03 MED ORDER — CEFAZOLIN SODIUM-DEXTROSE 2-4 GM/100ML-% IV SOLN
2.0000 g | INTRAVENOUS | Status: AC
  Administered 2023-08-03: 2 g via INTRAVENOUS
  Filled 2023-08-03: qty 100

## 2023-08-03 MED ORDER — ORAL CARE MOUTH RINSE: 15.0000 mL | Freq: Once | OROMUCOSAL | Status: AC

## 2023-08-03 MED ORDER — PHENYLEPHRINE 80 MCG/ML (10ML) SYRINGE FOR IV PUSH (FOR BLOOD PRESSURE SUPPORT)
PREFILLED_SYRINGE | INTRAVENOUS | Status: DC | PRN
  Administered 2023-08-03: 80 ug via INTRAVENOUS
  Administered 2023-08-03: 160 ug via INTRAVENOUS

## 2023-08-03 MED ORDER — MIDAZOLAM HCL 2 MG/2ML IJ SOLN
INTRAMUSCULAR | Status: DC | PRN
  Administered 2023-08-03: 2 mg via INTRAVENOUS

## 2023-08-03 MED ORDER — FENTANYL CITRATE (PF) 250 MCG/5ML IJ SOLN
INTRAMUSCULAR | Status: AC
  Filled 2023-08-03: qty 5

## 2023-08-03 MED ORDER — LIDOCAINE-EPINEPHRINE 2 %-1:100000 IJ SOLN
INTRAMUSCULAR | Status: AC
  Filled 2023-08-03: qty 1

## 2023-08-03 MED ORDER — SUGAMMADEX SODIUM 200 MG/2ML IV SOLN
INTRAVENOUS | Status: DC | PRN
  Administered 2023-08-03 (×2): 100 mg via INTRAVENOUS

## 2023-08-03 MED ORDER — LACTATED RINGERS IV SOLN: INTRAVENOUS | Status: DC

## 2023-08-03 MED ORDER — PHENYLEPHRINE HCL-NACL 20-0.9 MG/250ML-% IV SOLN
INTRAVENOUS | Status: DC | PRN
  Administered 2023-08-03: 80 ug via INTRAVENOUS
  Administered 2023-08-03 (×2): 120 ug via INTRAVENOUS

## 2023-08-03 MED ORDER — LIDOCAINE HCL URETHRAL/MUCOSAL 2 % EX GEL
CUTANEOUS | Status: AC
Start: 2023-08-03 — End: ?
  Filled 2023-08-03: qty 11

## 2023-08-03 MED ORDER — ROCURONIUM BROMIDE 10 MG/ML (PF) SYRINGE
PREFILLED_SYRINGE | INTRAVENOUS | Status: DC | PRN
  Administered 2023-08-03: 5 mg via INTRAVENOUS
  Administered 2023-08-03: 20 mg via INTRAVENOUS

## 2023-08-03 MED ORDER — CHLORHEXIDINE GLUCONATE 0.12 % MT SOLN
15.0000 mL | Freq: Once | OROMUCOSAL | Status: AC
  Administered 2023-08-03: 15 mL via OROMUCOSAL
  Filled 2023-08-03: qty 15

## 2023-08-03 MED ORDER — OXYMETAZOLINE HCL 0.05 % NA SOLN
NASAL | Status: AC
  Filled 2023-08-03: qty 30

## 2023-08-03 MED ORDER — LIDOCAINE-EPINEPHRINE 2 %-1:100000 IJ SOLN
INTRAMUSCULAR | Status: DC | PRN
  Administered 2023-08-03: 20 mL via INTRADERMAL

## 2023-08-03 MED ORDER — EPHEDRINE SULFATE-NACL 50-0.9 MG/10ML-% IV SOSY
PREFILLED_SYRINGE | INTRAVENOUS | Status: DC | PRN
  Administered 2023-08-03: 15 mg via INTRAVENOUS
  Administered 2023-08-03: 10 mg via INTRAVENOUS

## 2023-08-03 MED ORDER — IBUPROFEN 800 MG PO TABS: 800.0000 mg | ORAL_TABLET | Freq: Three times a day (TID) | ORAL | 0 refills | Status: AC | PRN

## 2023-08-03 MED ORDER — PROPOFOL 10 MG/ML IV BOLUS
INTRAVENOUS | Status: AC
  Filled 2023-08-03: qty 20

## 2023-08-03 MED ORDER — SUCCINYLCHOLINE CHLORIDE 200 MG/10ML IV SOSY
PREFILLED_SYRINGE | INTRAVENOUS | Status: DC | PRN
  Administered 2023-08-03: 70 mg via INTRAVENOUS

## 2023-08-03 MED ORDER — FENTANYL CITRATE (PF) 250 MCG/5ML IJ SOLN
INTRAMUSCULAR | Status: DC | PRN
  Administered 2023-08-03: 100 ug via INTRAVENOUS
  Administered 2023-08-03: 50 ug via INTRAVENOUS

## 2023-08-03 MED ORDER — LIDOCAINE-EPINEPHRINE 1 %-1:100000 IJ SOLN
INTRAMUSCULAR | Status: AC
  Filled 2023-08-03: qty 1

## 2023-08-03 MED ORDER — OXYMETAZOLINE HCL 0.05 % NA SOLN
NASAL | Status: DC | PRN
  Administered 2023-08-03: 1

## 2023-08-03 MED ORDER — ONDANSETRON HCL 4 MG/2ML IJ SOLN
INTRAMUSCULAR | Status: DC | PRN
  Administered 2023-08-03: 4 mg via INTRAVENOUS

## 2023-08-03 MED ORDER — DEXAMETHASONE SODIUM PHOSPHATE 10 MG/ML IJ SOLN
INTRAMUSCULAR | Status: DC | PRN
  Administered 2023-08-03: 10 mg via INTRAVENOUS

## 2023-08-03 SURGICAL SUPPLY — 26 items
BAG COUNTER SPONGE SURGICOUNT (BAG) IMPLANT
BLADE SURG 15 STRL LF DISP TIS (BLADE) ×2 IMPLANT
BUR CROSS CUT FISSURE 1.6 (BURR) ×2 IMPLANT
BUR EGG ELITE 4.0 (BURR) IMPLANT
CANISTER SUCT 3000ML PPV (MISCELLANEOUS) ×2 IMPLANT
COVER SURGICAL LIGHT HANDLE (MISCELLANEOUS) ×2 IMPLANT
GAUZE PACKING FOLDED 2 STR (GAUZE/BANDAGES/DRESSINGS) ×2 IMPLANT
GLOVE BIO SURGEON STRL SZ8 (GLOVE) ×2 IMPLANT
GOWN STRL REUS W/ TWL LRG LVL3 (GOWN DISPOSABLE) ×2 IMPLANT
GOWN STRL REUS W/ TWL XL LVL3 (GOWN DISPOSABLE) ×2 IMPLANT
IV NS 1000ML BAXH (IV SOLUTION) ×2 IMPLANT
KIT BASIN OR (CUSTOM PROCEDURE TRAY) ×2 IMPLANT
KIT TURNOVER KIT B (KITS) ×2 IMPLANT
NDL HYPO 25GX1X1/2 BEV (NEEDLE) ×4 IMPLANT
NEEDLE HYPO 25GX1X1/2 BEV (NEEDLE) ×2 IMPLANT
NS IRRIG 1000ML POUR BTL (IV SOLUTION) ×2 IMPLANT
PAD ARMBOARD POSITIONER FOAM (MISCELLANEOUS) ×2 IMPLANT
SLEEVE IRRIGATION ELITE 7 (MISCELLANEOUS) ×2 IMPLANT
SPIKE FLUID TRANSFER (MISCELLANEOUS) IMPLANT
SUT CHROMIC 3 0 SH 27 (SUTURE) IMPLANT
SUT PLAIN 3 0 PS2 27 (SUTURE) IMPLANT
SYR BULB IRRIG 60ML STRL (SYRINGE) ×2 IMPLANT
SYR CONTROL 10ML LL (SYRINGE) ×2 IMPLANT
TRAY ENT MC OR (CUSTOM PROCEDURE TRAY) ×2 IMPLANT
TUBING IRRIGATION (MISCELLANEOUS) ×2 IMPLANT
YANKAUER SUCT BULB TIP NO VENT (SUCTIONS) ×2 IMPLANT

## 2023-08-03 NOTE — Transfer of Care (Signed)
 Immediate Anesthesia Transfer of Care Note  Patient: Stephen Walsh  Procedure(s) Performed: Alveoplasty of maxilla and mandible, removal of bilateral lingual tori  Patient Location: PACU  Anesthesia Type:General  Level of Consciousness: awake, alert , and oriented  Airway & Oxygen Therapy: Patient Spontanous Breathing  Post-op Assessment: Report given to RN and Post -op Vital signs reviewed and stable  Post vital signs: Reviewed and stable  Last Vitals:  Vitals Value Taken Time  BP 127/81 08/03/23 0835  Temp 36.2 C 08/03/23 0835  Pulse 96 08/03/23 0842  Resp 16 08/03/23 0842  SpO2 93 % 08/03/23 0842  Vitals shown include unfiled device data.  Last Pain:  Vitals:   08/03/23 0835  TempSrc:   PainSc: Asleep         Complications: No notable events documented.

## 2023-08-03 NOTE — H&P (Signed)
 H&P documentation  -History and Physical Reviewed  -Patient has been re-examined  -No change in the plan of care  Stephen Walsh

## 2023-08-03 NOTE — Anesthesia Procedure Notes (Signed)
 Procedure Name: Intubation Date/Time: 08/03/2023 7:26 AM  Performed by: Hali Marry, CRNAPre-anesthesia Checklist: Patient identified, Emergency Drugs available, Suction available and Patient being monitored Patient Re-evaluated:Patient Re-evaluated prior to induction Oxygen Delivery Method: Circle system utilized Preoxygenation: Pre-oxygenation with 100% oxygen Induction Type: IV induction Ventilation: Two handed mask ventilation required Laryngoscope Size: Mac and 4 Grade View: Grade I Tube type: Oral Nasal Tubes: Nasal prep performed and Nasal Rae Tube size: 7.0 mm Number of attempts: 1 Airway Equipment and Method: Oral airway Placement Confirmation: ETT inserted through vocal cords under direct vision, positive ETCO2 and breath sounds checked- equal and bilateral Secured at: 28 cm Tube secured with: Tape Dental Injury: Teeth and Oropharynx as per pre-operative assessment

## 2023-08-03 NOTE — Op Note (Signed)
 NAME: Stephen Walsh, Stephen Walsh MEDICAL RECORD NO: [id number] ACCOUNT NO: [id number] DATE OF BIRTH: [birth year] FACILITY: [facility] LOCATION: [facility]-PERI[facility] PHYSICIAN: Georgia Lopes, DDS  Operative Report   DATE OF PROCEDURE: 08/03/2023  PREOPERATIVE DIAGNOSES: 1.  Irregular maxillary and mandibular bone contour. 2.  Bilateral mandibular lingual tori. 3.  Ankyloglossia.  POSTOPERATIVE DIAGNOSES: 1.  Irregular maxillary and mandibular bone contour. 2.  Bilateral mandibular lingual tori. 3.  Ankyloglossia.  PROCEDURE:  Alveoplasty, maxilla and mandible; Removal of bilateral mandibular lingual tori; Lingual frenectomy.  SURGEON:  Georgia Lopes, DDS  ANESTHESIA:  General.  Nasal intubation.  Dr. Richardson Landry, attending.  DESCRIPTION OF PROCEDURE:  The patient was taken to the operating room and a nasal endotracheal tube was placed and secured.  The eyes were protected.  The patient was draped for surgery.  A timeout was performed.  The posterior pharynx was suctioned and  a throat pack was placed.  2% lidocaine 1:100,000 epinephrine was infiltrated in an inferior alveolar block on the right and left sides and a buccal and palatal infiltration in the edentulous maxilla.  The left side was operated first.  A #15 blade was  used to make an incision at the second molar area of the alveolar crest of the mandible and carried forward along the alveolar crest to cross the midline.  The tissue was reflected buccally and lingually to expose the irregular alveolar bone contour and  the lingual torus.  Then, the Stryker handpiece was used to reduce the lingual torus so that there was normal bony contour of the lingual mandible and then the alveoloplasty was performed to create a smooth alveolar contour.  The bone file was then used  to smooth these areas and then the area was irrigated and closed with 3-0 chromic.  Then, a 15 blade was used to make an incision beginning in the second molar area of the left maxilla along the  alveolar crest, carried forward across the midline along  the alveolar crest.  The periosteum was reflected to expose the lingual buccal bone, which was irregular in contour as was the alveolar crest.  The egg bur followed by the bone file was used to perform an alveoloplasty.  This area was irrigated and  closed with 3-0 chromic.  Then, attention was turned to the right side of the mouth.  A #15 blade was used to make an incision in the second molar area of the right mandible, carried forward to connect with the incision from the left side.  The tissue  was reflected buccally and lingually.  A vertical releasing incision was made in the midline in the buccal aspect to allow for tissue reflection.  Then, the bony contour was smoothed using the egg burr and the lingual torus was removed using the egg bur  followed by the bone file.  Then, the area was irrigated and closed with 3-0 chromic.  A #15 blade was used to make an incision in the maxilla beginning in the second molar region carried forward to join the original incision from the left side.  The  tissue was reflected buccally.  The bone was smoothed.  Irregular contours were reduced and then the area was smoothed with the bone and then further with the bone file.  The area was irrigated and closed with 3-0 chromic.  The lingual frenectomy was  done using the 15 blade to make an incision along the lingual frenum as it is attached to the tongue.  The incision was approximately  1.5 cm.  The tissue was reflected and then closed in layers.  With 3-0 chromic gut interrupted sutures.  Then, the oral  cavity was irrigated and suctioned.  The throat pack was removed.  The patient was left in care of anesthesia for extubation and transport to recovery with plans for discharge home through Day Surgery.  ESTIMATED BLOOD LOSS:  Minimum.  COMPLICATIONS:  None.  SPECIMENS:  None.  COUNTS:  Correct.   PUS D: 08/03/2023 8:28:08 am T: 08/03/2023 9:02:00 am   JOB: [id number]/ [id number]

## 2023-08-03 NOTE — Anesthesia Postprocedure Evaluation (Signed)
 Anesthesia Post Note  Patient: Stephen Walsh  Procedure(s) Performed: Alveoplasty of maxilla and mandible, removal of bilateral lingual tori     Patient location during evaluation: PACU Anesthesia Type: General Level of consciousness: awake and alert Pain management: pain level controlled Vital Signs Assessment: post-procedure vital signs reviewed and stable Respiratory status: spontaneous breathing, nonlabored ventilation, respiratory function stable and patient connected to nasal cannula oxygen Cardiovascular status: blood pressure returned to baseline and stable Postop Assessment: no apparent nausea or vomiting Anesthetic complications: no  No notable events documented.  Last Vitals:  Vitals:   08/03/23 0845 08/03/23 0857  BP: (!) 118/100 132/74  Pulse: 93 100  Resp: 18 18  Temp:  (!) 36.2 C  SpO2: 94% 96%    Last Pain:  Vitals:   08/03/23 0857  TempSrc:   PainSc: 0-No pain                 Trevor Iha

## 2023-08-03 NOTE — Op Note (Signed)
 08/03/2023  8:22 AM  PATIENT:  Stephen Walsh  53 y.o. male  PRE-OPERATIVE DIAGNOSIS:  Irregular maxillary and mndibular bone contour, bilateral mandibular lingual tori, ankyloglossia  POST-OPERATIVE DIAGNOSIS:  SAME  PROCEDURE:  Procedure(s): Alveoplasty of maxilla and mandible, removal of bilateral lingual tori, lingual frenectomy  SURGEON:  Surgeon(s): Ocie Doyne, DMD  ANESTHESIA:   local and general  EBL:  minimal  DRAINS: none   SPECIMEN:  No Specimen  COUNTS:  YES  PLAN OF CARE: Discharge to home after PACU  PATIENT DISPOSITION:  PACU - hemodynamically stable.   PROCEDURE DETAILS: Dictation #[id number]  Georgia Lopes, DMD 08/03/2023 8:22 AM

## 2023-08-04 ENCOUNTER — Encounter (HOSPITAL_COMMUNITY): Payer: Self-pay | Admitting: Oral Surgery

## 2024-06-04 ENCOUNTER — Ambulatory Visit (INDEPENDENT_AMBULATORY_CARE_PROVIDER_SITE_OTHER): Admitting: Podiatry

## 2024-06-04 DIAGNOSIS — L609 Nail disorder, unspecified: Secondary | ICD-10-CM

## 2024-06-04 DIAGNOSIS — L603 Nail dystrophy: Secondary | ICD-10-CM

## 2024-06-04 NOTE — Progress Notes (Signed)
 "   Chief Complaint  Patient presents with   Ingrown Toenail    Left hallux, medial nail border. He has had the procedure on this border in 2023, it looks clean. He does states that there is some mild pain at the cuticle and corner of the nail. He would also like to to lok at the right hallux, the nail is split down the center.  Not diabetic, ASA   Discussed the use of AI scribe software for clinical note transcription with the patient, who gave verbal consent to proceed.  History of Present Illness Stephen Walsh is a 53 year old male with prior left great toe ingrown toenail medial border procedure who presents with irritation and callosity of the left great toe medial margin and chronic splitting of the right great toenail.  He underwent a permanent procedure for a severe ingrown toenail of the left great toe approximately two years ago. Since the procedure, he has not experienced regrowth or pain at the site. He describes persistent irritation localized along the medial aspect of the left great toenail, with intermittent development of hyperkeratotic tissue in the area. The irritation is bothersome, particularly when the hyperkeratosis accumulates, but he denies pain. He has not used topical treatments for this issue.  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 He does not recall a specific inciting trauma but suspects a remote injury may be contributory. The split is persistent and cosmetically concerning, though he denies pain or functional limitation.      Past Medical History:  Diagnosis Date   Alcohol abuse    pt states he stopped drinking alcohol in 2023   Anemia    Anxiety    Arthritis    COPD (chronic obstructive pulmonary disease) (HCC)    Coronary artery disease    Depression    GERD (gastroesophageal reflux disease)    History of heart attack 2010   Hx of CABG 2012    Hyperlipidemia    Hypertension    Myocardial infarction [facility]    Past Surgical History:  Procedure Laterality Date   APPENDECTOMY     BACK SURGERY  04/07/2019   [place]    CORONARY ARTERY BYPASS GRAFT  2012   KNEE ARTHROCENTESIS     right nee   LEFT HEART CATH AND CORS/GRAFTS ANGIOGRAPHY N/A 12/01/2021   Procedure: LEFT HEART CATH AND CORS/GRAFTS ANGIOGRAPHY;  Surgeon: Jordan, Peter M, MD;  Location: [facility] INVASIVE CV LAB;  Service: Cardiovascular;  Laterality: N/A;   TONSILECTOMY, ADENOIDECTOMY, BILATERAL MYRINGOTOMY AND TUBES     TOOTH EXTRACTION N/A 08/03/2023   Procedure: Alveoplasty of maxilla and mandible, removal of bilateral lingual tori;  Surgeon: Sheryle Hamilton, DMD;  Location: [facility] OR;  Service: Oral Surgery;  Laterality: N/A;  DENTAL EXTRACTIONS WITHOUT XRAYS   Allergies[1]  Physical Exam Palpable pedal pulses.  No open lesions are noted.  No edema is appreciated.  The left hallux medial nail border shows no signs of nail recurrence where the PNA had been previously performed.  There is some thickening of the skin and thickening of the nail plate edge present.  No surrounding erythema or active pain with compression is noted.  The right hallux nail has a longitudinal split within the entirety of the nail in the central aspect.  There is no erythema or pain with compression of the nail plate.  Mild yellowish discoloration is noted to the  toenail which could be consistent with mild onychomycosis.    Assessment/Plan of Care: 1. Onychoschizia   2. Nail abnormality    Assessment & Plan Callosity of left great toe medial nail margin Chronic callosity along the lateral aspect of the left great toe at the site of prior permanent ingrown toenail procedure, without evidence of nail regrowth or infection. The callosity causes irritation but not pain. - Recommended nightly application of a topical keratolytic agent to the affected area to soften and gradually dissolve the  hyperkeratotic tissue. The agent is non-caustic and should not cause pain or skin breakdown with proper use.  Recommended Kera nail urea nail gel 47% to be used nightly to the nail border and surrounding skin. - Instructed to apply the solution with an applicator brush or cotton-tipped applicator along the nail groove before bedtime.  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 The split is asymptomatic but cosmetically concerning. Conservative management is preferred, as nail removal may not address the underlying defect. - Recommended conservative management with application of cyanoacrylate adhesive (super glue) using a cotton-tipped applicator along the split to stabilize the nail and promote proper growth. - Advised to avoid contact of adhesive with surrounding skin and to allow the glue to dry before donning socks. - Discussed that reapplication may be necessary approximately once monthly or as the adhesive wears off.  Follow-up as needed  Awanda CHARM Imperial, DPM, FACFAS [facility]     [address] [place], [place] [address]                Office [phone]  Fax [phone]      [1] No Known Allergies  "

## 2024-06-04 NOTE — Patient Instructions (Signed)
